# Patient Record
Sex: Male | Born: 1940 | Race: White | Hispanic: No | Marital: Married | Smoking: Never smoker
Health system: Southern US, Community
[De-identification: ages and names within clinical notes are randomized; demographics above are authoritative.]

## PROBLEM LIST (undated history)

## (undated) DIAGNOSIS — R42 Dizziness and giddiness: Secondary | ICD-10-CM

## (undated) DIAGNOSIS — I5032 Chronic diastolic (congestive) heart failure: Secondary | ICD-10-CM

## (undated) DIAGNOSIS — E119 Type 2 diabetes mellitus without complications: Secondary | ICD-10-CM

## (undated) DIAGNOSIS — G459 Transient cerebral ischemic attack, unspecified: Secondary | ICD-10-CM

## (undated) DIAGNOSIS — K635 Polyp of colon: Secondary | ICD-10-CM

## (undated) DIAGNOSIS — I679 Cerebrovascular disease, unspecified: Secondary | ICD-10-CM

## (undated) DIAGNOSIS — I4891 Unspecified atrial fibrillation: Secondary | ICD-10-CM

## (undated) DIAGNOSIS — R079 Chest pain, unspecified: Secondary | ICD-10-CM

## (undated) DIAGNOSIS — R0600 Dyspnea, unspecified: Secondary | ICD-10-CM

## (undated) DIAGNOSIS — G4733 Obstructive sleep apnea (adult) (pediatric): Secondary | ICD-10-CM

## (undated) DIAGNOSIS — I10 Essential (primary) hypertension: Secondary | ICD-10-CM

## (undated) DIAGNOSIS — Z86010 Personal history of colonic polyps: Secondary | ICD-10-CM

## (undated) DIAGNOSIS — C4490 Unspecified malignant neoplasm of skin, unspecified: Secondary | ICD-10-CM

## (undated) DIAGNOSIS — E669 Obesity, unspecified: Secondary | ICD-10-CM

## (undated) DIAGNOSIS — M6289 Other specified disorders of muscle: Secondary | ICD-10-CM

## (undated) DIAGNOSIS — N182 Chronic kidney disease, stage 2 (mild): Secondary | ICD-10-CM

## (undated) DIAGNOSIS — E785 Hyperlipidemia, unspecified: Secondary | ICD-10-CM

## (undated) HISTORY — DX: Other specified disorders of muscle: M62.89

## (undated) HISTORY — DX: Chronic diastolic (congestive) heart failure: I50.32

## (undated) HISTORY — DX: Obstructive sleep apnea (adult) (pediatric): G47.33

## (undated) HISTORY — DX: Hyperlipidemia, unspecified: E78.5

## (undated) HISTORY — PX: COLONOSCOPY: SHX174

## (undated) HISTORY — PX: CARDIOVASCULAR STRESS TEST: SHX262

## (undated) HISTORY — DX: Unspecified malignant neoplasm of skin, unspecified: C44.90

## (undated) HISTORY — DX: Polyp of colon: K63.5

## (undated) HISTORY — DX: Transient cerebral ischemic attack, unspecified: G45.9

## (undated) HISTORY — PX: ROTATOR CUFF REPAIR: SHX139

## (undated) HISTORY — PX: LEG SURGERY: SHX1003

## (undated) HISTORY — DX: Dyspnea, unspecified: R06.00

## (undated) HISTORY — PX: DOPPLER ECHOCARDIOGRAPHY: SHX263

## (undated) HISTORY — DX: Chronic kidney disease, stage 2 (mild): N18.2

## (undated) HISTORY — PX: TONSILLECTOMY: SUR1361

## (undated) HISTORY — DX: Type 2 diabetes mellitus without complications: E11.9

## (undated) HISTORY — DX: Chest pain, unspecified: R07.9

## (undated) HISTORY — DX: Obesity, unspecified: E66.9

## (undated) HISTORY — DX: Dizziness and giddiness: R42

## (undated) HISTORY — DX: Cerebrovascular disease, unspecified: I67.9

## (undated) HISTORY — DX: Personal history of colonic polyps: Z86.010

---

## 1999-09-09 ENCOUNTER — Ambulatory Visit (HOSPITAL_COMMUNITY): Admission: RE | Admit: 1999-09-09 | Discharge: 1999-09-09 | Payer: Self-pay | Admitting: Gastroenterology

## 2002-10-19 ENCOUNTER — Inpatient Hospital Stay (HOSPITAL_COMMUNITY): Admission: EM | Admit: 2002-10-19 | Discharge: 2002-10-20 | Payer: Self-pay | Admitting: Emergency Medicine

## 2002-10-19 ENCOUNTER — Encounter: Payer: Self-pay | Admitting: Emergency Medicine

## 2002-10-19 ENCOUNTER — Encounter: Payer: Self-pay | Admitting: Internal Medicine

## 2002-10-20 ENCOUNTER — Encounter: Payer: Self-pay | Admitting: Internal Medicine

## 2002-10-24 ENCOUNTER — Ambulatory Visit (HOSPITAL_COMMUNITY): Admission: RE | Admit: 2002-10-24 | Discharge: 2002-10-24 | Payer: Self-pay | Admitting: Family Medicine

## 2003-10-03 ENCOUNTER — Ambulatory Visit (HOSPITAL_COMMUNITY): Admission: RE | Admit: 2003-10-03 | Discharge: 2003-10-03 | Payer: Self-pay | Admitting: Family Medicine

## 2007-09-07 DIAGNOSIS — E785 Hyperlipidemia, unspecified: Secondary | ICD-10-CM

## 2007-09-07 HISTORY — DX: Hyperlipidemia, unspecified: E78.5

## 2007-12-28 ENCOUNTER — Encounter: Admission: RE | Admit: 2007-12-28 | Discharge: 2007-12-28 | Payer: Self-pay | Admitting: Orthopedic Surgery

## 2008-04-26 ENCOUNTER — Ambulatory Visit (HOSPITAL_COMMUNITY): Admission: RE | Admit: 2008-04-26 | Discharge: 2008-04-26 | Payer: Self-pay | Admitting: Family Medicine

## 2010-07-05 ENCOUNTER — Inpatient Hospital Stay (HOSPITAL_COMMUNITY)
Admission: EM | Admit: 2010-07-05 | Discharge: 2010-07-07 | Payer: Self-pay | Source: Home / Self Care | Admitting: Emergency Medicine

## 2010-07-11 ENCOUNTER — Encounter (INDEPENDENT_AMBULATORY_CARE_PROVIDER_SITE_OTHER): Payer: Self-pay | Admitting: Emergency Medicine

## 2010-07-11 ENCOUNTER — Emergency Department (HOSPITAL_COMMUNITY): Admission: EM | Admit: 2010-07-11 | Discharge: 2010-07-11 | Payer: Self-pay | Admitting: Emergency Medicine

## 2010-07-29 ENCOUNTER — Inpatient Hospital Stay (HOSPITAL_COMMUNITY): Admission: AD | Admit: 2010-07-29 | Discharge: 2010-08-05 | Payer: Self-pay | Admitting: Orthopedic Surgery

## 2010-08-01 ENCOUNTER — Ambulatory Visit: Payer: Self-pay | Admitting: Vascular Surgery

## 2010-11-17 LAB — CROSSMATCH
ABO/RH(D): O POS
Antibody Screen: NEGATIVE
Unit division: 0
Unit division: 0
Unit division: 0
Unit division: 0

## 2010-11-17 LAB — GLUCOSE, CAPILLARY
Glucose-Capillary: 111 mg/dL — ABNORMAL HIGH (ref 70–99)
Glucose-Capillary: 120 mg/dL — ABNORMAL HIGH (ref 70–99)
Glucose-Capillary: 125 mg/dL — ABNORMAL HIGH (ref 70–99)
Glucose-Capillary: 127 mg/dL — ABNORMAL HIGH (ref 70–99)
Glucose-Capillary: 131 mg/dL — ABNORMAL HIGH (ref 70–99)
Glucose-Capillary: 149 mg/dL — ABNORMAL HIGH (ref 70–99)
Glucose-Capillary: 170 mg/dL — ABNORMAL HIGH (ref 70–99)
Glucose-Capillary: 176 mg/dL — ABNORMAL HIGH (ref 70–99)
Glucose-Capillary: 178 mg/dL — ABNORMAL HIGH (ref 70–99)
Glucose-Capillary: 179 mg/dL — ABNORMAL HIGH (ref 70–99)
Glucose-Capillary: 179 mg/dL — ABNORMAL HIGH (ref 70–99)
Glucose-Capillary: 192 mg/dL — ABNORMAL HIGH (ref 70–99)
Glucose-Capillary: 196 mg/dL — ABNORMAL HIGH (ref 70–99)
Glucose-Capillary: 260 mg/dL — ABNORMAL HIGH (ref 70–99)
Glucose-Capillary: 350 mg/dL — ABNORMAL HIGH (ref 70–99)
Glucose-Capillary: 77 mg/dL (ref 70–99)
Glucose-Capillary: 81 mg/dL (ref 70–99)
Glucose-Capillary: 82 mg/dL (ref 70–99)
Glucose-Capillary: 99 mg/dL (ref 70–99)

## 2010-11-17 LAB — CBC
HCT: 23.9 % — ABNORMAL LOW (ref 39.0–52.0)
HCT: 26.2 % — ABNORMAL LOW (ref 39.0–52.0)
HCT: 26.2 % — ABNORMAL LOW (ref 39.0–52.0)
HCT: 26.9 % — ABNORMAL LOW (ref 39.0–52.0)
HCT: 32.6 % — ABNORMAL LOW (ref 39.0–52.0)
Hemoglobin: 10 g/dL — ABNORMAL LOW (ref 13.0–17.0)
Hemoglobin: 10.4 g/dL — ABNORMAL LOW (ref 13.0–17.0)
Hemoglobin: 10.5 g/dL — ABNORMAL LOW (ref 13.0–17.0)
Hemoglobin: 7.7 g/dL — ABNORMAL LOW (ref 13.0–17.0)
Hemoglobin: 8.9 g/dL — ABNORMAL LOW (ref 13.0–17.0)
MCH: 26.9 pg (ref 26.0–34.0)
MCH: 27.4 pg (ref 26.0–34.0)
MCH: 27.4 pg (ref 26.0–34.0)
MCH: 27.8 pg (ref 26.0–34.0)
MCH: 27.8 pg (ref 26.0–34.0)
MCH: 29.2 pg (ref 26.0–34.0)
MCHC: 31.9 g/dL (ref 30.0–36.0)
MCHC: 32.1 g/dL (ref 30.0–36.0)
MCHC: 32.2 g/dL (ref 30.0–36.0)
MCHC: 32.9 g/dL (ref 30.0–36.0)
MCHC: 34 g/dL (ref 30.0–36.0)
MCV: 84.1 fL (ref 78.0–100.0)
MCV: 84.2 fL (ref 78.0–100.0)
MCV: 85.1 fL (ref 78.0–100.0)
MCV: 85.3 fL (ref 78.0–100.0)
MCV: 85.9 fL (ref 78.0–100.0)
Platelets: 168 10*3/uL (ref 150–400)
Platelets: 242 10*3/uL (ref 150–400)
Platelets: 306 10*3/uL (ref 150–400)
Platelets: 401 10*3/uL — ABNORMAL HIGH (ref 150–400)
RBC: 2.81 MIL/uL — ABNORMAL LOW (ref 4.22–5.81)
RBC: 3.05 MIL/uL — ABNORMAL LOW (ref 4.22–5.81)
RBC: 3.2 MIL/uL — ABNORMAL LOW (ref 4.22–5.81)
RBC: 3.6 MIL/uL — ABNORMAL LOW (ref 4.22–5.81)
RBC: 3.87 MIL/uL — ABNORMAL LOW (ref 4.22–5.81)
RBC: 3.91 MIL/uL — ABNORMAL LOW (ref 4.22–5.81)
RDW: 13.8 % (ref 11.5–15.5)
RDW: 15 % (ref 11.5–15.5)
RDW: 15.1 % (ref 11.5–15.5)
RDW: 15.5 % (ref 11.5–15.5)
RDW: 15.8 % — ABNORMAL HIGH (ref 11.5–15.5)
WBC: 3.8 10*3/uL — ABNORMAL LOW (ref 4.0–10.5)
WBC: 3.9 10*3/uL — ABNORMAL LOW (ref 4.0–10.5)
WBC: 3.9 10*3/uL — ABNORMAL LOW (ref 4.0–10.5)
WBC: 4.2 10*3/uL (ref 4.0–10.5)
WBC: 4.3 10*3/uL (ref 4.0–10.5)
WBC: 4.5 10*3/uL (ref 4.0–10.5)
WBC: 5.4 10*3/uL (ref 4.0–10.5)

## 2010-11-17 LAB — SURGICAL PCR SCREEN
MRSA, PCR: NEGATIVE
Staphylococcus aureus: NEGATIVE

## 2010-11-17 LAB — BASIC METABOLIC PANEL
BUN: 15 mg/dL (ref 6–23)
BUN: 17 mg/dL (ref 6–23)
BUN: 19 mg/dL (ref 6–23)
BUN: 22 mg/dL (ref 6–23)
CO2: 23 mEq/L (ref 19–32)
CO2: 24 mEq/L (ref 19–32)
CO2: 24 mEq/L (ref 19–32)
CO2: 25 mEq/L (ref 19–32)
CO2: 25 mEq/L (ref 19–32)
Calcium: 8.1 mg/dL — ABNORMAL LOW (ref 8.4–10.5)
Calcium: 8.2 mg/dL — ABNORMAL LOW (ref 8.4–10.5)
Calcium: 8.2 mg/dL — ABNORMAL LOW (ref 8.4–10.5)
Calcium: 8.4 mg/dL (ref 8.4–10.5)
Calcium: 9.1 mg/dL (ref 8.4–10.5)
Chloride: 103 mEq/L (ref 96–112)
Chloride: 107 mEq/L (ref 96–112)
Chloride: 108 mEq/L (ref 96–112)
Chloride: 109 mEq/L (ref 96–112)
Chloride: 109 mEq/L (ref 96–112)
Creatinine, Ser: 1.35 mg/dL (ref 0.4–1.5)
Creatinine, Ser: 1.55 mg/dL — ABNORMAL HIGH (ref 0.4–1.5)
Creatinine, Ser: 1.56 mg/dL — ABNORMAL HIGH (ref 0.4–1.5)
GFR calc Af Amer: 54 mL/min — ABNORMAL LOW (ref >60)
GFR calc Af Amer: 54 mL/min — ABNORMAL LOW (ref >60)
GFR calc Af Amer: 59 mL/min — ABNORMAL LOW (ref >60)
GFR calc Af Amer: >60 mL/min (ref >60)
GFR calc Af Amer: >60 mL/min (ref >60)
GFR calc non Af Amer: 44 mL/min — ABNORMAL LOW (ref >60)
GFR calc non Af Amer: 45 mL/min — ABNORMAL LOW (ref >60)
GFR calc non Af Amer: 52 mL/min — ABNORMAL LOW (ref >60)
GFR calc non Af Amer: 59 mL/min — ABNORMAL LOW (ref >60)
Glucose, Bld: 169 mg/dL — ABNORMAL HIGH (ref 70–99)
Glucose, Bld: 174 mg/dL — ABNORMAL HIGH (ref 70–99)
Glucose, Bld: 62 mg/dL — ABNORMAL LOW (ref 70–99)
Glucose, Bld: 75 mg/dL (ref 70–99)
Potassium: 4.2 mEq/L (ref 3.5–5.1)
Potassium: 4.5 mEq/L (ref 3.5–5.1)
Potassium: 4.6 mEq/L (ref 3.5–5.1)
Potassium: 4.8 mEq/L (ref 3.5–5.1)
Sodium: 135 mEq/L (ref 135–145)
Sodium: 138 mEq/L (ref 135–145)
Sodium: 139 mEq/L (ref 135–145)
Sodium: 140 mEq/L (ref 135–145)

## 2010-11-17 LAB — PROTIME-INR
INR: 1.13 (ref 0.00–1.49)
INR: 1.19 (ref 0.00–1.49)
Prothrombin Time: 14.7 seconds (ref 11.6–15.2)
Prothrombin Time: 15.3 seconds — ABNORMAL HIGH (ref 11.6–15.2)

## 2010-11-17 LAB — ANAEROBIC CULTURE

## 2010-11-17 LAB — TISSUE CULTURE: Culture: NO GROWTH

## 2010-11-17 LAB — PREPARE RBC (CROSSMATCH)

## 2010-11-17 LAB — APTT: aPTT: 29 seconds (ref 24–37)

## 2010-11-18 LAB — COMPREHENSIVE METABOLIC PANEL
ALT: 18 U/L (ref 0–53)
AST: 20 U/L (ref 0–37)
Albumin: 3.3 g/dL — ABNORMAL LOW (ref 3.5–5.2)
Alkaline Phosphatase: 101 U/L (ref 39–117)
Calcium: 8.1 mg/dL — ABNORMAL LOW (ref 8.4–10.5)
GFR calc Af Amer: 44 mL/min — ABNORMAL LOW (ref >60)
Potassium: 3.4 mEq/L — ABNORMAL LOW (ref 3.5–5.1)
Sodium: 139 mEq/L (ref 135–145)
Total Protein: 5.5 g/dL — ABNORMAL LOW (ref 6.0–8.3)

## 2010-11-18 LAB — DIFFERENTIAL
Basophils Relative: 0 % (ref 0–1)
Eosinophils Absolute: 0 10*3/uL (ref 0.0–0.7)
Lymphs Abs: 0.9 10*3/uL (ref 0.7–4.0)
Monocytes Absolute: 0.4 10*3/uL (ref 0.1–1.0)
Monocytes Relative: 4 % (ref 3–12)

## 2010-11-18 LAB — CBC
HCT: 35.5 % — ABNORMAL LOW (ref 39.0–52.0)
Hemoglobin: 10.2 g/dL — ABNORMAL LOW (ref 13.0–17.0)
MCH: 28.5 pg (ref 26.0–34.0)
MCHC: 33.8 g/dL (ref 30.0–36.0)
Platelets: 185 10*3/uL (ref 150–400)
RBC: 3.58 MIL/uL — ABNORMAL LOW (ref 4.22–5.81)
RDW: 13.6 % (ref 11.5–15.5)
WBC: 6.1 10*3/uL (ref 4.0–10.5)
WBC: 9.4 10*3/uL (ref 4.0–10.5)

## 2010-11-18 LAB — GLUCOSE, CAPILLARY
Glucose-Capillary: 210 mg/dL — ABNORMAL HIGH (ref 70–99)
Glucose-Capillary: 224 mg/dL — ABNORMAL HIGH (ref 70–99)
Glucose-Capillary: 252 mg/dL — ABNORMAL HIGH (ref 70–99)

## 2010-11-18 LAB — POCT I-STAT, CHEM 8
BUN: 29 mg/dL — ABNORMAL HIGH (ref 6–23)
Calcium, Ion: 0.9 mmol/L — ABNORMAL LOW (ref 1.12–1.32)
Potassium: 3.5 mEq/L (ref 3.5–5.1)
Sodium: 139 mEq/L (ref 135–145)
TCO2: 21 mmol/L (ref 0–100)

## 2010-11-18 LAB — CROSSMATCH
ABO/RH(D): O POS
Unit division: 0
Unit division: 0

## 2010-11-18 LAB — BASIC METABOLIC PANEL
BUN: 26 mg/dL — ABNORMAL HIGH (ref 6–23)
Calcium: 8.3 mg/dL — ABNORMAL LOW (ref 8.4–10.5)
Creatinine, Ser: 1.63 mg/dL — ABNORMAL HIGH (ref 0.4–1.5)
GFR calc non Af Amer: 42 mL/min — ABNORMAL LOW (ref >60)

## 2010-11-18 LAB — PROTIME-INR: INR: 1.08 (ref 0.00–1.49)

## 2010-11-18 LAB — URINALYSIS, ROUTINE W REFLEX MICROSCOPIC
Glucose, UA: 100 mg/dL — AB
Nitrite: NEGATIVE
Protein, ur: NEGATIVE mg/dL
Urobilinogen, UA: 0.2 mg/dL (ref 0.0–1.0)

## 2010-11-18 LAB — MRSA PCR SCREENING: MRSA by PCR: NEGATIVE

## 2010-11-18 LAB — ABO/RH: ABO/RH(D): O POS

## 2010-11-18 LAB — APTT: aPTT: 21 seconds — ABNORMAL LOW (ref 24–37)

## 2011-01-22 NOTE — Discharge Summary (Signed)
NAME:  Micheal Holt, Micheal Holt NO.:  [id number]   MEDICAL RECORD NO.:  [id number]                   PATIENT TYPE:  INP   LOCATION:  4703                                 FACILITY:  [facility]   PHYSICIAN:  Thornell Mule, M.D.                DATE OF BIRTH:  04/19/1941   DATE OF ADMISSION:  10/19/2002  DATE OF DISCHARGE:  10/20/2002                                 DISCHARGE SUMMARY   DISCHARGE DIAGNOSES:  1. Transient ischemic attack.  2. Hypertension.  3. Diabetes.   DISCHARGE MEDICATIONS:  1. Plavix 75 mg p.o. daily.  2. Glucophage 500 mg 3 pills q.h.s.  3. Verapamil 240 mg p.o. daily.   FOLLOW UP:  The patient has a followup appointment with Dr. Arelia Sneddon which was  previously scheduled and the patient was instructed to call Dr. Kennedy Bucker to  try to set up a neurology appointment or the patient can also try to set  this up via his primary care physician.  He was given instructions and the  phone number for Dr. Kennedy Bucker.   HISTORY OF PRESENT ILLNESS:  This is a 70 year old male with a previous  medical history significant for hypertension in the 80s who was in his usual  state of health when suddenly on the day of admission he felt detached and  somewhat dizzy with right-handed weakness and numbness.  He mentions he was  unable to do what he intended with computer mouse.  The patient got  anxious and wanted to come to the ER.   PHYSICAL EXAMINATION:  GENERAL APPEARANCE:  The patient was a little bit  anxious.  His physical examination was benign.  VITAL SIGNS:  Pulse 62, blood pressure 230/147 which later decreased to  180/93, temperature 97.8, respiratory rate 12.  His O2 saturation was 99% on  room air.  NEUROLOGIC:  Examination was nonfocal.  His cranial nerves were intact.  His  deep tendon reflexes were 1+ throughout.  His strength was 5/5.  He had no  mentioned sensory deficits.   LABORATORY DATA:  He had an EKG which was sinus rhythm with first  degree AV  block.   His troponins were 0.02, CK 59, MB 1.2.  He had an ABG which showed pH 7.45,  pCO2 of 35.9, pO2 of 83.  He had a BMP with a sodium of 139, potassium 3.1,  chloride 110, bicarb 23, BUN 15 and creatinine of 1.3.  The patient's  glucose was 182.  LFTs were within normal limits.  He had a negative urine  drug screen.  Negative UA.  His white blood cell count was 4.8, hemoglobin  15.1, platelets 249.  PT 13.9 and PTT was 32.   HOSPITAL COURSE:  1. Transient ischemic attack:  The patient had no neurologic findings on     examination.  His blood pressure became under better control with one  dose of Labetalol in the ER.  We followed up his troponin, CK and MB     which continued to be within normal range. Troponin remained at 0.02     throughout the three times it was drawn.  The CK 59 to 58, and MB was     from 1.2 to 1.0.  The patient had a TSH which was within normal limits at     3.011 and a cortisol level of 5.2.  A carotid Doppler was recommended,     however, being a Sunday night, it was not done.  The patient had a CT of     the head which was negative.  The patient had an MRI/MRA which showed     intracranial atherosclerotic disease involving the right ACA, right MCA     and distal basilar.  There was also moderate stenosis in the right     posterior cerebral artery in the mid portion.  The final results were not     available prior to discharge, however, they will review it with the     radiologist by Dr. Sharlet Salina prior to discharge.  The patient appeared to     have a __________  from his TIA.  His blood pressure as mentioned before     was under control and we decided to continue him on the verapamil and     aspirin.  He was educated on the importance of having a followup with his     primary care physician.  2. Diabetes:  He had moderate to good control with his ADA diet and     Glucophage.  3. The patient did complain of urinary frequency, however, it was  attributed     secondary to his diabetes.   CONDITION ON DISCHARGE:  The patient was stable.  His vital signs were  temperature 97.9, blood pressure 152/70, heart rate 50.  He was saturating  96% on room air.  He was in no acute distress.  Heart was regular rate and  rhythm.  Lungs were bilaterally clear to auscultation  with good air  movement.  His abdomen was benign.  He had no clubbing, cyanosis, or edema.   His labs on discharge were sodium 138, potassium 3.7, chloride 107, bicarb  24, glucose 133, BUN 14, creatinine 1.3.                                               Thornell Mule, M.D.    LC/MEDQ  D:  11/26/2002  T:  11/27/2002  Job:  [id number]   cc:   C. Milta Deiters, M.D.  [address] [place]  [place] [address]  Fax: [phone]
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
## 2013-01-27 ENCOUNTER — Other Ambulatory Visit: Payer: Self-pay | Admitting: Cardiovascular Disease

## 2013-04-09 ENCOUNTER — Other Ambulatory Visit: Payer: Self-pay | Admitting: Cardiovascular Disease

## 2013-04-09 NOTE — Telephone Encounter (Signed)
Rx was sent to pharmacy electronically. 

## 2013-04-10 ENCOUNTER — Other Ambulatory Visit: Payer: Self-pay | Admitting: Cardiovascular Disease

## 2013-04-10 NOTE — Telephone Encounter (Signed)
Rx was sent to pharmacy electronically. 

## 2013-04-11 ENCOUNTER — Ambulatory Visit (INDEPENDENT_AMBULATORY_CARE_PROVIDER_SITE_OTHER): Payer: Medicare Other | Admitting: Cardiovascular Disease

## 2013-04-11 ENCOUNTER — Encounter: Payer: Self-pay | Admitting: *Deleted

## 2013-04-11 VITALS — BP 116/66 | Ht 70.0 in | Wt 255.8 lb

## 2013-04-11 DIAGNOSIS — R12 Heartburn: Secondary | ICD-10-CM

## 2013-04-11 DIAGNOSIS — G4733 Obstructive sleep apnea (adult) (pediatric): Secondary | ICD-10-CM

## 2013-04-11 DIAGNOSIS — I669 Occlusion and stenosis of unspecified cerebral artery: Secondary | ICD-10-CM

## 2013-04-11 DIAGNOSIS — N182 Chronic kidney disease, stage 2 (mild): Secondary | ICD-10-CM

## 2013-04-11 DIAGNOSIS — I10 Essential (primary) hypertension: Secondary | ICD-10-CM

## 2013-04-11 DIAGNOSIS — E785 Hyperlipidemia, unspecified: Secondary | ICD-10-CM

## 2013-04-11 DIAGNOSIS — I6622 Occlusion and stenosis of left posterior cerebral artery: Secondary | ICD-10-CM

## 2013-04-11 DIAGNOSIS — E119 Type 2 diabetes mellitus without complications: Secondary | ICD-10-CM

## 2013-04-11 NOTE — Patient Instructions (Addendum)
Your physician recommends that you schedule a follow-up appointment in: One year.  

## 2013-04-12 DIAGNOSIS — E785 Hyperlipidemia, unspecified: Secondary | ICD-10-CM | POA: Insufficient documentation

## 2013-04-12 DIAGNOSIS — G4733 Obstructive sleep apnea (adult) (pediatric): Secondary | ICD-10-CM | POA: Insufficient documentation

## 2013-04-12 DIAGNOSIS — I10 Essential (primary) hypertension: Secondary | ICD-10-CM | POA: Insufficient documentation

## 2013-04-12 DIAGNOSIS — I672 Cerebral atherosclerosis: Secondary | ICD-10-CM | POA: Insufficient documentation

## 2013-04-12 DIAGNOSIS — N184 Chronic kidney disease, stage 4 (severe): Secondary | ICD-10-CM | POA: Insufficient documentation

## 2013-04-12 DIAGNOSIS — R12 Heartburn: Secondary | ICD-10-CM | POA: Insufficient documentation

## 2013-04-12 DIAGNOSIS — E1165 Type 2 diabetes mellitus with hyperglycemia: Secondary | ICD-10-CM | POA: Insufficient documentation

## 2013-04-12 NOTE — Assessment & Plan Note (Signed)
He has made small but real progress with weight loss he is congratulated and encouraged to persist in his efforts

## 2013-04-12 NOTE — Assessment & Plan Note (Signed)
Adequate control. 

## 2013-04-12 NOTE — Assessment & Plan Note (Signed)
Most recent hemoglobin A1c in October 2000 was 7.2% approaching the goal level but higher than last checked in June 2013 when it was 7.1%

## 2013-04-12 NOTE — Assessment & Plan Note (Signed)
Stable renal function. Most recent creatinine 1.04 is actually an improvement corresponding to a GFR of about 70.

## 2013-04-12 NOTE — Assessment & Plan Note (Signed)
No neurological complaints. On clopidogrel since 2004 secondary to a transient ischemic attack.

## 2013-04-12 NOTE — Assessment & Plan Note (Signed)
The most recent lipid profile that I have from October shows satisfactory lipid levels with the exception of a borderline low HDL of 38. Additional weight loss and better glycemic control will help to improve this number.

## 2013-04-12 NOTE — Progress Notes (Signed)
Patient ID: Micheal Holt, male   DOB: 1941/08/08, 72 y.o.   MRN: [id number]     Reason for office visit Followup chest pain and cardiovascular risk factors  Micheal Holt has made some progress with weight loss. He now weighs about 10 pounds less than he did a year ago. He is walking on a regular basis at a relatively slow pace. He denies any problems with chest pain or shortness of breath. Has not had any recurrence of heart failure or lower showed edema. Diabetes control is fair, albeit imperfect. His blood pressure is well controlled and his renal function actually seems to have improved. He is compliant with statin medication.    No Known Allergies  Current Outpatient Prescriptions  Medication Sig Dispense Refill  . aspirin 81 MG tablet Take 81 mg by mouth daily.      Marland Kitchen atorvastatin (LIPITOR) 20 MG tablet TAKE 1 TABLET BY MOUTH EVERY NIGHT AT BEDTIME  30 tablet  5  . clopidogrel (PLAVIX) 75 MG tablet TAKE 1 TABLET BY MOUTH ONCE DAILY  30 tablet  6  . furosemide (LASIX) 40 MG tablet Take 40 mg by mouth daily.      . metFORMIN (GLUCOPHAGE) 1000 MG tablet Take 1,000 mg by mouth 2 (two) times daily with a meal.      . Potassium Chloride Crys CR (KLOR-CON M20 PO) Take 20 mEq by mouth 2 (two) times daily.      . solifenacin (VESICARE) 10 MG tablet Take 10 mg by mouth daily.      Marland Kitchen terazosin (HYTRIN) 10 MG capsule TAKE 1 CAPSULE BY MOUTH EVERY NIGHT AT BEDTIME  30 capsule  5   No current facility-administered medications for this visit.    Past Medical History  Diagnosis Date  . Chronic diastolic CHF (congestive heart failure), NYHA class 1     currently euvolemic  . Systemic hypertension   . Obstructive sleep apnea     he refused to use cpap  . Obesity     moderate to severe  . DM (diabetes mellitus)     type 2  . Chronic kidney disease (CKD), stage II (mild)   . Chest pain     most likely Gastroesophageal reflux  . TIA (transient ischemic attack)     in Feb of 2004  . Cerebrovascular  disease     by MRA in Feb 2004 with moderate posterior cerebral artery stenosis    Past Surgical History  Procedure Laterality Date  . Doppler echocardiography      renal artery and they were within normal limits  . Cardiovascular stress test      within normal limits EF of 54%    History reviewed. No pertinent family history.  History   Social History  . Marital Status: Married    Spouse Name: N/A    Number of Children: N/A  . Years of Education: N/A   Occupational History  . Not on file.   Social History Main Topics  . Smoking status: Former Research scientist (life sciences)  . Smokeless tobacco: Not on file  . Alcohol Use: No  . Drug Use: Not on file  . Sexually Active: Not on file   Other Topics Concern  . Not on file   Social History Narrative  . No narrative on file    Review of systems: The patient specifically denies any chest pain at rest or with exertion, dyspnea at rest or with exertion, orthopnea, paroxysmal nocturnal dyspnea, syncope, palpitations, focal neurological deficits,  intermittent claudication, lower extremity edema, unexplained weight gain, cough, hemoptysis or wheezing. He seems to describe dysphagia. The patient also denies abdominal pain, nausea, vomiting, diarrhea, constipation, polyuria, polydipsia, dysuria, hematuria, frequency, urgency, abnormal bleeding or bruising, fever, chills, unexpected weight changes, mood swings, change in skin or hair texture, change in voice quality, auditory or visual problems, allergic reactions or rashes, new musculoskeletal complaints other than usual "aches and pains".   PHYSICAL EXAM BP 116/66  Ht 5\' 10"  (1.778 m)  Wt 255 lb 12.8 oz (116.03 kg)  BMI 36.7 kg/m2  General: Alert, oriented x3, no distress Head: no evidence of trauma, PERRL, EOMI, no exophtalmos or lid lag, no myxedema, no xanthelasma; normal ears, nose and oropharynx Neck: normal jugular venous pulsations and no hepatojugular reflux; brisk carotid pulses without  delay and no carotid bruits Chest: clear to auscultation, no signs of consolidation by percussion or palpation, normal fremitus, symmetrical and full respiratory excursions Cardiovascular: normal position and quality of the apical impulse, regular rhythm, normal first and second heart sounds, no murmurs, rubs, +ve S4 Abdomen: no tenderness or distention, no masses by palpation, no abnormal pulsatility or arterial bruits, normal bowel sounds, no hepatosplenomegaly Extremities: no clubbing, cyanosis or edema; 2+ radial, ulnar and brachial pulses bilaterally; 2+ right femoral, posterior tibial and dorsalis pedis pulses; 2+ left femoral, posterior tibial and dorsalis pedis pulses; no subclavian or femoral bruits Neurological: grossly nonfocal   EKG: Normal sinus rhythm, normal tracing  Lipid Panel  October 2013 total cholesterol 107, triglycerides 84, HDL 38, LDL 52 Hemoglobin A1c 7.2% June 2013 creatinine 1.04, estimated GFR 72, potassium 4.3, sodium 147  BMET    Component Value Date/Time   NA 140 08/05/2010 0410   K 4.3 08/05/2010 0410   CL 109 08/05/2010 0410   CO2 24 08/05/2010 0410   GLUCOSE 127* 08/05/2010 0410   BUN 19 08/05/2010 0410   CREATININE 1.33 08/05/2010 0410   CALCIUM 8.2* 08/05/2010 0410   GFRNONAA 53* 08/05/2010 0410   GFRAA  Value: >60        The eGFR has been calculated using the MDRD equation. This calculation has not been validated in all clinical situations. eGFR's persistently <60 mL/min signify possible Chronic Kidney Disease. 08/05/2010 0410     ASSESSMENT AND PLAN Heartburn The chest discomfort that he describes as previous appointment was completely resolved when he took omeprazole. The symptoms recurred when he discontinued the medication so he still using it on an as-needed basis. I have told him that he needs to seek gastroenterology evaluation should he develop dysphagia, gastrointestinal bleeding or unexplained weight loss. After I mentioned that, he  describes the fact that he has noticed that his "throat to smaller" than in the past. He may be describing a stricture. I recommended that he seek GI evaluation.  OSA (obstructive sleep apnea) He did not tolerate CPAP when he tried in the past and is not willing to try again  HTN (hypertension) Adequate control  Hyperlipidemia The most recent lipid profile that I have from October shows satisfactory lipid levels with the exception of a borderline low HDL of 38. Additional weight loss and better glycemic control will help to improve this number.  CKD (chronic kidney disease) stage 2, GFR 60-89 ml/min Stable renal function. Most recent creatinine 1.04 is actually an improvement corresponding to a GFR of about 70.  DM2 (diabetes mellitus, type 2) Most recent hemoglobin A1c in October 2000 was 7.2% approaching the goal level but higher than last checked  in June 2013 when it was 7.1%  Severe obesity (BMI 35.0-39.9) He has made small but real progress with weight loss he is congratulated and encouraged to persist in his efforts  Stenosis of posterior cerebral artery No neurological complaints. On clopidogrel since 2004 secondary to a transient ischemic attack.   Orders Placed This Encounter  Procedures  . EKG 12-Lead   Meds ordered this encounter  Medications  . aspirin 81 MG tablet    Sig: Take 81 mg by mouth daily.  . solifenacin (VESICARE) 10 MG tablet    Sig: Take 10 mg by mouth daily.  . metFORMIN (GLUCOPHAGE) 1000 MG tablet    Sig: Take 1,000 mg by mouth 2 (two) times daily with a meal.  . Potassium Chloride Crys CR (KLOR-CON M20 PO)    Sig: Take 20 mEq by mouth 2 (two) times daily.  . furosemide (LASIX) 40 MG tablet    Sig: Take 40 mg by mouth daily.    Holli Humbles, MD, [facility] [phone] office [phone] pager

## 2013-04-12 NOTE — Assessment & Plan Note (Signed)
He did not tolerate CPAP when he tried in the past and is not willing to try again

## 2013-04-12 NOTE — Assessment & Plan Note (Signed)
The chest discomfort that he describes as previous appointment was completely resolved when he took omeprazole. The symptoms recurred when he discontinued the medication so he still using it on an as-needed basis. I have told him that he needs to seek gastroenterology evaluation should he develop dysphagia, gastrointestinal bleeding or unexplained weight loss. After I mentioned that, he describes the fact that he has noticed that his "throat to smaller" than in the past. He may be describing a stricture. I recommended that he seek GI evaluation.

## 2013-05-16 ENCOUNTER — Other Ambulatory Visit: Payer: Self-pay | Admitting: Cardiovascular Disease

## 2013-05-16 NOTE — Telephone Encounter (Signed)
Rx was sent to pharmacy electronically. 

## 2013-09-04 ENCOUNTER — Other Ambulatory Visit: Payer: Self-pay | Admitting: Cardiovascular Disease

## 2013-09-04 NOTE — Telephone Encounter (Signed)
Rx was sent to pharmacy electronically. 

## 2013-10-25 ENCOUNTER — Other Ambulatory Visit: Payer: Self-pay | Admitting: Cardiovascular Disease

## 2013-12-31 ENCOUNTER — Other Ambulatory Visit: Payer: Self-pay | Admitting: Cardiovascular Disease

## 2013-12-31 NOTE — Telephone Encounter (Signed)
Rx was sent to pharmacy electronically. 

## 2014-01-16 ENCOUNTER — Other Ambulatory Visit: Payer: Self-pay | Admitting: Cardiovascular Disease

## 2014-01-16 NOTE — Telephone Encounter (Signed)
Rx was sent to pharmacy electronically. 

## 2014-05-11 ENCOUNTER — Other Ambulatory Visit: Payer: Self-pay | Admitting: Cardiovascular Disease

## 2014-05-14 NOTE — Telephone Encounter (Signed)
Rx refill sent to patient pharmacy   

## 2014-05-15 ENCOUNTER — Telehealth: Payer: Self-pay | Admitting: Cardiovascular Disease

## 2014-05-15 NOTE — Telephone Encounter (Signed)
States our office denied a medication but he does not know the name of the drug.  Spoke w/pharmacist who doesn't show we denied any med in fact has several meds to be picked up.  In the meantime patient did make a yearly appt. Called patient back and gave him above info.  Told to call if he is missing any meds - not to let himself run out and we will see him for his appt 10/15.  Patient voiced understanding.

## 2014-05-15 NOTE — Telephone Encounter (Signed)
Pt called in stating that he called in to the pharmacy for his prescriptions to be refilled and he said that those medications were denied and he is not sure why. Please call  Thanks

## 2014-06-10 ENCOUNTER — Other Ambulatory Visit: Payer: Self-pay | Admitting: Cardiovascular Disease

## 2014-06-10 NOTE — Telephone Encounter (Signed)
Rx was sent to pharmacy electronically. OV 10/14

## 2014-06-13 ENCOUNTER — Other Ambulatory Visit: Payer: Self-pay | Admitting: Cardiovascular Disease

## 2014-06-14 NOTE — Telephone Encounter (Signed)
Rx was sent to pharmacy electronically. 

## 2014-06-15 ENCOUNTER — Other Ambulatory Visit: Payer: Self-pay | Admitting: Cardiovascular Disease

## 2014-06-17 NOTE — Telephone Encounter (Signed)
Refused >> Refilled #30 tablet with 0 refills on 06/14/2014  Appointment 06/19/14

## 2014-06-19 ENCOUNTER — Ambulatory Visit (INDEPENDENT_AMBULATORY_CARE_PROVIDER_SITE_OTHER): Payer: Medicare Other | Admitting: Cardiovascular Disease

## 2014-06-19 ENCOUNTER — Encounter: Payer: Self-pay | Admitting: Cardiovascular Disease

## 2014-06-19 VITALS — BP 168/88 | HR 67 | Resp 16 | Ht 70.0 in | Wt 250.4 lb

## 2014-06-19 DIAGNOSIS — E1122 Type 2 diabetes mellitus with diabetic chronic kidney disease: Secondary | ICD-10-CM

## 2014-06-19 DIAGNOSIS — E785 Hyperlipidemia, unspecified: Secondary | ICD-10-CM

## 2014-06-19 DIAGNOSIS — I10 Essential (primary) hypertension: Secondary | ICD-10-CM

## 2014-06-19 DIAGNOSIS — G4733 Obstructive sleep apnea (adult) (pediatric): Secondary | ICD-10-CM

## 2014-06-19 DIAGNOSIS — N182 Chronic kidney disease, stage 2 (mild): Secondary | ICD-10-CM

## 2014-06-19 DIAGNOSIS — N189 Chronic kidney disease, unspecified: Secondary | ICD-10-CM

## 2014-06-19 DIAGNOSIS — I672 Cerebral atherosclerosis: Secondary | ICD-10-CM

## 2014-06-19 MED ORDER — AMLODIPINE BESYLATE 5 MG PO TABS: 5.0000 mg | ORAL_TABLET | Freq: Every day | ORAL | Status: DC

## 2014-06-19 MED ORDER — TAMSULOSIN HCL 0.4 MG PO CAPS: 0.4000 mg | ORAL_CAPSULE | Freq: Every day | ORAL | Status: DC

## 2014-06-19 NOTE — Progress Notes (Signed)
Patient ID: Micheal Holt, male   DOB: 06-18-1941, 73 y.o.   MRN: [id number]      Reason for office visit Obstructive sleep apnea, hyperlipidemia, obesity, history of TIA, hypertension  Micheal Holt is now 73 years old and has recently developed some gait instability and headaches. A couple of nights ago when he got out of bed to use the bathroom the room was spinning and he had to hold onto the walls to avoid falling. Has noticed a tendency to lean forward when he walks. Over the last couple of weeks has also had intermittent mild headaches, which never bothered him in the past.  His diabetes is poorly controlled, his most recent hemoglobin A1c was greater than 11%. His last lipid profile was good.  He has a long standing history of obesity, type 2 diabetes mellitus, hypertension, mild chronic kidney disease, hyperlipidemia. He has obstructive sleep apnea but will not tolerate CPAP. He has a remote history of a transient ischemic attack and has been taking clopidogrel for this since 2004. He is known to have stenosis of a posterior cerebral artery.  He has had intermittent problems with chest discomfort, not currently an issue. These seemed to respond to treatment with a proton pump inhibitor. He had a normal nuclear stress test in 2009 (diaphragmatic attenuation artifact) and has normal left ventricular systolic function with mild diastolic dysfunction by echo in 2012. His left atrium is mild to moderately dilated.  No Known Allergies Current Outpatient Prescriptions on File Prior to Visit  Medication Sig Dispense Refill  . aspirin 81 MG tablet Take 81 mg by mouth daily.      Marland Kitchen atorvastatin (LIPITOR) 20 MG tablet Take 1 tablet (20 mg total) by mouth daily. *APPOINTMENT NEEDED FOR FURTHER REFILLS*  30 tablet  1  . benazepril (LOTENSIN) 40 MG tablet TAKE 1 TABLET BY MOUTH DAILY FOR HIGH BLOOD PRESSURE  30 tablet  0  . clopidogrel (PLAVIX) 75 MG tablet TAKE 1 TABLET BY MOUTH ONCE DAILY  30 tablet  1  .  furosemide (LASIX) 40 MG tablet TAKE 1 TABLET BY MOUTH DAILY MAY INCREASE TO 2 TABS BY MOUTH DAILY IFWEIGHT INCREASES TO MORE THEN 2 POUNDS FROM BASELINE  45 tablet  5  . metFORMIN (GLUCOPHAGE) 1000 MG tablet Take 1,000 mg by mouth 2 (two) times daily with a meal.      . omeprazole (PRILOSEC) 40 MG capsule TAKE 1 CAPSULE BY MOUTH ONCE DAILY  30 capsule  4  . Potassium Chloride Crys CR (KLOR-CON M20 PO) Take 20 mEq by mouth 2 (two) times daily.      . solifenacin (VESICARE) 10 MG tablet Take 10 mg by mouth daily.           Terazosin (Hytrin) 10 mg tablet      Take 10 mg by mouth daily No current facility-administered medications on file prior to visit.   Past Medical History  Diagnosis Date  . Chronic diastolic CHF (congestive heart failure), NYHA class 1     currently euvolemic  . Systemic hypertension   . Obstructive sleep apnea     he refused to use cpap  . Obesity     moderate to severe  . DM (diabetes mellitus)     type 2  . Chronic kidney disease (CKD), stage II (mild)   . Chest pain     most likely Gastroesophageal reflux  . TIA (transient ischemic attack)     in Feb of 2004  . Cerebrovascular  disease     by MRA in Feb 2004 with moderate posterior cerebral artery stenosis    Past Surgical History  Procedure Laterality Date  . Doppler echocardiography      renal artery and they were within normal limits  . Cardiovascular stress test      within normal limits EF of 54%    No family history on file.  History   Social History  . Marital Status: Married    Spouse Name: N/A    Number of Children: N/A  . Years of Education: N/A   Occupational History  . Not on file.   Social History Main Topics  . Smoking status: Former Research scientist (life sciences)  . Smokeless tobacco: Not on file  . Alcohol Use: No  . Drug Use: Not on file  . Sexual Activity: Not on file   Other Topics Concern  . Not on file   Social History Narrative  . No narrative on file    Review of  systems: Orthostatic dizziness, unsteady gait, frequency and urgency, headaches The patient specifically denies any chest pain at rest or with exertion, dyspnea at rest or with exertion, orthopnea, paroxysmal nocturnal dyspnea, syncope, palpitations, focal neurological deficits, intermittent claudication, lower extremity edema, unexplained weight gain, cough, hemoptysis or wheezing.  The patient also denies abdominal pain, nausea, vomiting, dysphagia, diarrhea, constipation, polyuria, polydipsia, dysuria, hematuria, frequency, urgency, abnormal bleeding or bruising, fever, chills, unexpected weight changes, mood swings, change in skin or hair texture, change in voice quality, auditory or visual problems, allergic reactions or rashes, new musculoskeletal complaints other than usual "aches and pains".   PHYSICAL EXAM BP 168/88  Pulse 67  Resp 16  Ht 5' 10" (1.778 m)  Wt 113.581 kg (250 lb 6.4 oz)  BMI 35.93 kg/m2 When rechecked his blood pressure was still high at 158/88 mm Hg General: Alert, oriented x3, no distress  Head: no evidence of trauma, PERRL, EOMI, no exophtalmos or lid lag, no myxedema, no xanthelasma; normal ears, nose and oropharynx  Neck: normal jugular venous pulsations and no hepatojugular reflux; brisk carotid pulses without delay and no carotid bruits  Chest: clear to auscultation, no signs of consolidation by percussion or palpation, normal fremitus, symmetrical and full respiratory excursions  Cardiovascular: normal position and quality of the apical impulse, regular rhythm, normal first and second heart sounds, no murmurs, rubs, +ve S4  Abdomen: no tenderness or distention, no masses by palpation, no abnormal pulsatility or arterial bruits, normal bowel sounds, no hepatosplenomegaly  Extremities: no clubbing, cyanosis or edema; 2+ radial, ulnar and brachial pulses bilaterally; 2+ right femoral, posterior tibial and dorsalis pedis pulses; 2+ left femoral, posterior tibial and  dorsalis pedis pulses; no subclavian or femoral bruits  Neurological: grossly nonfocal   EKG: Normal sinus rhythm with first degree AV block (PR 266 ms), nonspecific lateral ST-T-segment changes, QTC 462 ms  Lipid Panel  No results found for this basename: chol, trig, hdl, cholhdl, vldl, ldlcalc, ldldirect    BMET    Component Value Date/Time   NA 140 08/05/2010 0410   K 4.3 08/05/2010 0410   CL 109 08/05/2010 0410   CO2 24 08/05/2010 0410   GLUCOSE 127* 08/05/2010 0410   BUN 19 08/05/2010 0410   CREATININE 1.33 08/05/2010 0410   CALCIUM 8.2* 08/05/2010 0410   GFRNONAA 53* 08/05/2010 0410   GFRAA  Value: >60        The eGFR has been calculated using the MDRD equation. This calculation has not been validated  in all clinical situations. eGFR's persistently <60 mL/min signify possible Chronic Kidney Disease. 08/05/2010 0410     ASSESSMENT AND PLAN OSA (obstructive sleep apnea)  He did not tolerate CPAP when he tried in the past and is not willing to try again  HTN (hypertension)  His blood pressure is elevated and this may explain his headaches. In the past he took for right middle, but this was stopped (I think because of the first degree A-V block). We'll try low-dose amlodipine. I wonder whether his orthostatic dizziness and gait difficulties might be related to alpha blocker induced orthostatic hypotension. Stop Hytrin and try Flomax. Hyperlipidemia  I don't have his most recent results, but will get him from Dr. Arelia Sneddon. His residual problem has always been a low HDL cholesterol. Additional weight loss and better glycemic control will help to improve this number.  CKD (chronic kidney disease) stage 2, GFR 60-89 ml/min  Stable renal function.  DM2 (diabetes mellitus, type 2)  There has been marked deterioration in glycemic control, I think this is attributable to less attention to diet. Severe obesity (BMI 35.0-39.9)  He has gained some weight since his last appointment. He  eats out a lot and may be getting more sodium than he expects. He does not add salt to his food. These may be the causes for deterioration in blood pressure control.  Stenosis of posterior cerebral artery  No neurological complaints. On clopidogrel since 2004 secondary to a transient ischemic attack.  Meds ordered this encounter  Medications  . glimepiride (AMARYL) 4 MG tablet    Sig: Take 4 mg by mouth daily. Take 1 and 1/2 tablet  . Probiotic Product (PROBIOTIC DAILY PO)    Sig: Take 1 capsule by mouth daily.    Holli Humbles, MD, [place] [phone] office [phone] pager

## 2014-06-19 NOTE — Patient Instructions (Signed)
Your physician has recommended you make the following change in your medication: stop the terazosin (hytrin). Start new prescriptions for amlodipine and tamsulosin. These have already been sent to your pharmacy.   Your physician recommends that you schedule a follow-up appointment in: 4-6 weeks with Dr. Sallyanne Kuster.

## 2014-06-29 ENCOUNTER — Other Ambulatory Visit: Payer: Self-pay | Admitting: Cardiovascular Disease

## 2014-07-01 NOTE — Telephone Encounter (Signed)
Request for benazepril sent to pharmacy electronically. Request for Omeprazole routed to pharmacist, K Alvstad, to advise due to Clopidogrel-Omeprazole interaction.

## 2014-07-02 ENCOUNTER — Other Ambulatory Visit: Payer: Self-pay | Admitting: Pharmacist Clinician (PhC)/ Clinical Pharmacy Specialist

## 2014-07-02 MED ORDER — PANTOPRAZOLE SODIUM 40 MG PO TBEC: 40.0000 mg | DELAYED_RELEASE_TABLET | Freq: Every day | ORAL | Status: DC

## 2014-07-05 ENCOUNTER — Other Ambulatory Visit: Payer: Self-pay | Admitting: Cardiovascular Disease

## 2014-07-05 NOTE — Telephone Encounter (Signed)
Rx was sent to pharmacy electronically. 

## 2014-07-08 ENCOUNTER — Other Ambulatory Visit: Payer: Self-pay | Admitting: Cardiovascular Disease

## 2014-07-09 NOTE — Telephone Encounter (Signed)
Rx was sent to pharmacy electronically. 

## 2014-08-05 ENCOUNTER — Other Ambulatory Visit: Payer: Self-pay | Admitting: Cardiovascular Disease

## 2014-08-05 NOTE — Telephone Encounter (Signed)
Rx was sent to pharmacy electronically. 

## 2014-10-11 ENCOUNTER — Other Ambulatory Visit: Payer: Self-pay | Admitting: Cardiovascular Disease

## 2014-10-11 NOTE — Telephone Encounter (Signed)
Rx refill sent to patient pharmacy   

## 2015-01-02 ENCOUNTER — Other Ambulatory Visit: Payer: Self-pay | Admitting: Cardiovascular Disease

## 2015-01-02 NOTE — Telephone Encounter (Signed)
Rx(s) sent to pharmacy electronically.  

## 2015-01-07 ENCOUNTER — Encounter: Payer: Self-pay | Admitting: *Deleted

## 2015-01-30 ENCOUNTER — Encounter: Payer: Self-pay | Admitting: Cardiovascular Disease

## 2015-04-14 ENCOUNTER — Other Ambulatory Visit: Payer: Self-pay | Admitting: Physician Assistant

## 2015-04-14 DIAGNOSIS — R51 Headache: Principal | ICD-10-CM

## 2015-04-14 DIAGNOSIS — R519 Headache, unspecified: Secondary | ICD-10-CM

## 2015-04-23 ENCOUNTER — Ambulatory Visit
Admission: RE | Admit: 2015-04-23 | Discharge: 2015-04-23 | Disposition: A | Discharge status: Home / Self Care | Payer: Medicare Other | Source: Ambulatory Visit | Attending: Physician Assistant | Admitting: Physician Assistant

## 2015-04-23 DIAGNOSIS — R519 Headache, unspecified: Secondary | ICD-10-CM

## 2015-04-23 DIAGNOSIS — R51 Headache: Principal | ICD-10-CM

## 2015-05-05 ENCOUNTER — Encounter: Payer: Self-pay | Admitting: Diagnostic Neuroimaging

## 2015-05-05 ENCOUNTER — Ambulatory Visit (INDEPENDENT_AMBULATORY_CARE_PROVIDER_SITE_OTHER): Payer: Medicare Other | Admitting: Diagnostic Neuroimaging

## 2015-05-05 VITALS — BP 144/81 | HR 71 | Ht 70.0 in | Wt 252.4 lb

## 2015-05-05 DIAGNOSIS — R519 Headache, unspecified: Secondary | ICD-10-CM

## 2015-05-05 DIAGNOSIS — R51 Headache: Secondary | ICD-10-CM | POA: Diagnosis not present

## 2015-05-05 NOTE — Progress Notes (Signed)
[facility]  PATIENT: Micheal Holt DOB: 12-06-1940  REFERRING CLINICIAN: Gwyneth Revels HISTORY FROM: patient REASON FOR VISIT: new consult    HISTORICAL  CHIEF COMPLAINT:  Chief Complaint  Patient presents with  . Headache    rm 7, New Patient    HISTORY OF PRESENT ILLNESS:   74 year old right-handed male here for evaluation of headache. Patient has history of hypertension and diabetes.  For past 5 days patient has had new onset of global dull constant pressure headache with nausea and intermittent vomiting. No photophobia or phonophobia. Headaches are worse in the morning and slightly better later in the day. Headaches have been unremitting for past 5 weeks.  No prior history of headaches or migraine. No family history of migraine. Patient denies alcohol tobacco or illicit drug use. No caffeine use.    REVIEW OF SYSTEMS: Full 14 system review of systems performed and notable only for headache.  ALLERGIES: No Known Allergies  HOME MEDICATIONS: Outpatient Prescriptions Prior to Visit  Medication Sig Dispense Refill  . amLODipine (NORVASC) 5 MG tablet TAKE 1 TABLET BY MOUTH DAILY 30 tablet 5  . aspirin 81 MG tablet Take 81 mg by mouth daily.    Marland Kitchen atorvastatin (LIPITOR) 20 MG tablet Take 1 tablet (20 mg total) by mouth daily. 30 tablet 10  . benazepril (LOTENSIN) 40 MG tablet TAKE 1 TABLET BY MOUTH ONCE DAILY FOR HIGH BLOOD PRESSURE 30 tablet 11  . clopidogrel (PLAVIX) 75 MG tablet TAKE 1 TABLET BY MOUTH DAILY 30 tablet 10  . furosemide (LASIX) 40 MG tablet TAKE 1 TABLET BY MOUTH DAILY, MAY INCREASE TO 2 TABLETS DAILY IF WEIGHT INCREASES TO MORE THAN 2 POUNDS FROM BASELINE 45 tablet 8  . glimepiride (AMARYL) 4 MG tablet Take 4 mg by mouth daily. Take 1 and 1/2 tablet    . metFORMIN (GLUCOPHAGE) 1000 MG tablet Take 1,000 mg by mouth 2 (two) times daily with a meal.    . omeprazole (PRILOSEC) 40 MG capsule TAKE 1 CAPSULE BY MOUTH ONCE DAILY 30 capsule 10  .  pantoprazole (PROTONIX) 40 MG tablet Take 1 tablet (40 mg total) by mouth daily. 30 tablet 11  . Potassium Chloride Crys CR (KLOR-CON M20 PO) Take 20 mEq by mouth 2 (two) times daily.    . solifenacin (VESICARE) 10 MG tablet Take 10 mg by mouth daily.    . tamsulosin (FLOMAX) 0.4 MG CAPS capsule TAKE 1 CAPSULE BY MOUTH EVERY NIGHT AT BEDTIME 30 capsule 5  . Probiotic Product (PROBIOTIC DAILY PO) Take 1 capsule by mouth daily.     No facility-administered medications prior to visit.    PAST MEDICAL HISTORY: Past Medical History  Diagnosis Date  . Chronic diastolic CHF (congestive heart failure), NYHA class 1     currently euvolemic  . Systemic hypertension   . Obstructive sleep apnea     he refused to use cpap  . Obesity     moderate to severe  . DM (diabetes mellitus)     type 2  . Chronic kidney disease (CKD), stage II (mild)   . Chest pain     most likely Gastroesophageal reflux  . TIA (transient ischemic attack)     in Feb of 2004  . Cerebrovascular disease     by MRA in Feb 2004 with moderate posterior cerebral artery stenosis  . Dyspnea   . Dyslipidemia 2009  . Hyperlipidemia     PAST SURGICAL HISTORY: Past Surgical History  Procedure Laterality  Date  . Doppler echocardiography      renal artery and they were within normal limits  . Cardiovascular stress test      within normal limits EF of 54%    FAMILY HISTORY: Family History  Problem Relation Age of Onset  . Stroke Mother   . Heart failure Father   . Diabetes type II Father   . Cancer Sister   . Arthritis Maternal Grandmother   . Heart failure Maternal Grandfather   . Diabetes type I Paternal Grandmother   . Dementia Paternal Grandfather   . Heart failure Paternal Grandfather     SOCIAL HISTORY:  Social History   Social History  . Marital Status: Married    Spouse Name: N/A  . Number of Children: 2  . Years of Education: masters    Occupational History  . Not on file.   Social History Main  Topics  . Smoking status: Former Research scientist (life sciences)  . Smokeless tobacco: Never Used  . Alcohol Use: No  . Drug Use: No  . Sexual Activity: Not on file   Other Topics Concern  . Not on file   Social History Narrative   Married,    Caffeine use- none     PHYSICAL EXAM  GENERAL EXAM/CONSTITUTIONAL: Vitals:  Filed Vitals:   05/05/15 1250  BP: 144/81  Pulse: 71  Height: '5\' 10"'  (1.778 m)  Weight: 252 lb 6.4 oz (114.488 kg)     Body mass index is 36.22 kg/(m^2).  Visual Acuity Screening   Right eye Left eye Both eyes  Without correction:     With correction: 20/100 20/40   Comments: 05/05/15 bi focals    Patient is in no distress; well developed, nourished and groomed; neck is supple  CARDIOVASCULAR:  Examination of carotid arteries is normal; no carotid bruits  Regular rate and rhythm, no murmurs  Examination of peripheral vascular system by observation and palpation is normal  DISHEVELED APPEARANCE; MULTIPLE FOOD STAINS ON SHIRT AND PANTS  EYES:  Ophthalmoscopic exam of optic discs and posterior segments is normal; no papilledema or hemorrhages  MUSCULOSKELETAL:  Gait, strength, tone, movements noted in Neurologic exam below  NEUROLOGIC: MENTAL STATUS:  No flowsheet data found.  awake, alert, oriented to person, place and time  recent and remote memory intact  normal attention and concentration  language fluent, comprehension intact, naming intact,   fund of knowledge appropriate  CRANIAL NERVE:   2nd - no papilledema on fundoscopic exam  2nd, 3rd, 4th, 6th - pupils equal and reactive to light, visual fields full to confrontation, extraocular muscles intact, no nystagmus  5th - facial sensation symmetric  7th - facial strength symmetric  8th - hearing intact  9th - palate elevates symmetrically, uvula midline  11th - shoulder shrug symmetric  12th - tongue protrusion midline  MOTOR:   normal bulk and tone, full strength in the BUE,  BLE  SENSORY:   normal and symmetric to light touch, temperature, ABSENT VIB AT TOES  COORDINATION:   finger-nose-finger, fine finger movements normal  REFLEXES:   deep tendon reflexes TRACE and symmetric  GAIT/STATION:   narrow based gait; STOOPED POSTURE, romberg is negative    DIAGNOSTIC DATA (LABS, IMAGING, TESTING) - I reviewed patient records, labs, notes, testing and imaging myself where available.  Lab Results  Component Value Date   WBC 5.4 08/05/2010   HGB 10.0* 08/05/2010   HCT 30.7* 08/05/2010   MCV 85.3 08/05/2010   PLT 225 08/05/2010  Component Value Date/Time   NA 140 08/05/2010 0410   K 4.3 08/05/2010 0410   CL 109 08/05/2010 0410   CO2 24 08/05/2010 0410   GLUCOSE 127* 08/05/2010 0410   BUN 19 08/05/2010 0410   CREATININE 1.33 08/05/2010 0410   CALCIUM 8.2* 08/05/2010 0410   PROT 5.5* 07/05/2010 2209   ALBUMIN 3.3* 07/05/2010 2209   AST 20 07/05/2010 2209   ALT 18 07/05/2010 2209   ALKPHOS 101 07/05/2010 2209   BILITOT 0.5 07/05/2010 2209   GFRNONAA 53* 08/05/2010 0410   GFRAA  08/05/2010 0410    >60        The eGFR has been calculated using the MDRD equation. This calculation has not been validated in all clinical situations. eGFR's persistently <60 mL/min signify possible Chronic Kidney Disease.   No results found for: CHOL, HDL, LDLCALC, LDLDIRECT, TRIG, CHOLHDL No results found for: HGBA1C No results found for: VITAMINB12 No results found for: TSH   04/23/15 MRI brain [I reviewed images myself and agree with interpretation. -VRP]  - Marked BILATERAL ventricular prominence of uncertain significance, but favor global atrophy. Deep white matter hyperintensity is both focal and confluent, and favored to represent chronic microvascular ischemic change. No acute intracranial findings.    ASSESSMENT AND PLAN  74 y.o. year old male here with daily headaches since March 2016, although per cardiology notes, headaches have been  there since at least Oct 2015.    Ddx: Chronic daily headache - Plan: Sedimentation Rate, C-reactive Protein - likely due to uncontrolled hypertension, diabetes, untreated sleep apnea - other possible causes would include pseudotumor cerebri or temporal arteritis   PLAN: - check ESR, CRP to rule out temporal arteritis - check lumbar puncture to measure opening pressure for pseudotumor cerebri evaluation - encouraged patient to resume CPAP therapy for sleep apnea; would likely need new sleep study since last evaluation was more than 4 years ago; patient is not sure he wants to try this - encouraged hydration, proper nutrition, exercise - follow up with PCP re: HTN and diabetes control  Orders Placed This Encounter  Procedures  . Sedimentation Rate  . C-reactive Protein   Return in about 3 months (around 08/05/2015).    Penni Bombard, MD 5/94/7076, 1:51 PM Certified in Neurology, Neurophysiology and Neuroimaging  [facility] [address], [address], [place] [address] [phone]

## 2015-05-05 NOTE — Patient Instructions (Signed)
I will check lab testing and lumbar puncture.  Consider repeat sleep study to help resume CPAP for sleep apnea.  Use tylenol as needed for headaches.

## 2015-05-06 LAB — SEDIMENTATION RATE: SED RATE: 2 mm/h (ref 0–30)

## 2015-05-06 LAB — C-REACTIVE PROTEIN: CRP: 1 mg/L (ref 0.0–4.9)

## 2015-05-07 ENCOUNTER — Telehealth: Payer: Self-pay | Admitting: *Deleted

## 2015-05-07 NOTE — Telephone Encounter (Signed)
-----   Message from Penni Bombard, MD sent at 05/07/2015  8:32 AM EDT ----- pls call with normal labs.

## 2015-05-07 NOTE — Telephone Encounter (Signed)
Spoke with patient and per Dr Leta Baptist, informed him his lab results are normal. He verbalized understanding, appreciation.

## 2015-05-10 ENCOUNTER — Other Ambulatory Visit: Payer: Self-pay | Admitting: Cardiovascular Disease

## 2015-05-16 ENCOUNTER — Other Ambulatory Visit: Payer: Self-pay | Admitting: Cardiovascular Disease

## 2015-05-16 NOTE — Telephone Encounter (Signed)
Rx(s) sent to pharmacy electronically.  

## 2015-05-20 ENCOUNTER — Telehealth: Payer: Self-pay | Admitting: Diagnostic Neuroimaging

## 2015-05-20 NOTE — Telephone Encounter (Signed)
Pt called and is wonder if he needs a LP. Says during his last office visit he was told he did. Please call and advise [phone]

## 2015-05-20 NOTE — Telephone Encounter (Addendum)
I called and LMVM for [facility] [phone] (spine services), to let them know about pts test order.  I also called pt and gave him [phone] to call and schedule.  Looks like order faxed over 05-15-15 to jennifer at [facility]  (on order).

## 2015-05-26 ENCOUNTER — Telehealth: Payer: Self-pay | Admitting: Diagnostic Neuroimaging

## 2015-05-26 ENCOUNTER — Encounter: Payer: Self-pay | Admitting: Cardiovascular Disease

## 2015-05-26 ENCOUNTER — Telehealth: Payer: Self-pay | Admitting: Cardiovascular Disease

## 2015-05-26 NOTE — Telephone Encounter (Signed)
error 

## 2015-05-26 NOTE — Telephone Encounter (Signed)
Need clearance from cardiology re: plavix. Then proceed with LP. -VRP

## 2015-05-26 NOTE — Telephone Encounter (Signed)
Received call from [place], Rudy stating they have not gotten information re: stopping Plavix prior to patient's LP. She is inquiring what Dr Leta Baptist would like to do. Transferred her call to Dr Leta Baptist.

## 2015-05-26 NOTE — Telephone Encounter (Signed)
Shewants to know if you received fax on 05-20-15 to stop his Plavix for a procedure. If so, please fax back asap.

## 2015-05-28 ENCOUNTER — Telehealth: Payer: Self-pay | Admitting: *Deleted

## 2015-05-28 NOTE — Telephone Encounter (Signed)
Cardiac clearance letter faxed to [facility] with OK to hold Plavix 5 days prior to Lumbar puncture.

## 2015-05-28 NOTE — Telephone Encounter (Signed)
Dr. Loletha Grayer sent letter through [facility] 05/26/15.  Consent faxed with second request 05/28/15 to [facility].  OK to hold Plavix 5 days prior to Lumbar Puncture.

## 2015-06-02 ENCOUNTER — Other Ambulatory Visit: Payer: Self-pay | Admitting: Cardiovascular Disease

## 2015-06-02 NOTE — Telephone Encounter (Signed)
Rx request sent to pharmacy.  

## 2015-06-03 ENCOUNTER — Ambulatory Visit
Admission: RE | Admit: 2015-06-03 | Discharge: 2015-06-03 | Disposition: A | Discharge status: Home / Self Care | Payer: Medicare Other | Source: Ambulatory Visit | Attending: Diagnostic Neuroimaging | Admitting: Diagnostic Neuroimaging

## 2015-06-03 ENCOUNTER — Other Ambulatory Visit: Payer: Self-pay | Admitting: Diagnostic Neuroimaging

## 2015-06-03 DIAGNOSIS — R51 Headache: Principal | ICD-10-CM

## 2015-06-03 DIAGNOSIS — R519 Headache, unspecified: Secondary | ICD-10-CM

## 2015-06-03 LAB — GLUCOSE, CSF: GLUCOSE CSF: 109 mg/dL — AB (ref 43–76)

## 2015-06-03 LAB — CSF CELL COUNT WITH DIFFERENTIAL
RBC Count, CSF: 790 cu mm — ABNORMAL HIGH
TUBE #: 3
WBC, CSF: 3 cu mm (ref 0–5)

## 2015-06-03 LAB — PROTEIN, CSF: TOTAL PROTEIN, CSF: 98 mg/dL — AB (ref 15–45)

## 2015-06-03 NOTE — Progress Notes (Signed)
Dr. Jobe Igo brought wife in to explain discharge instructions and to report that LP went fine.

## 2015-06-03 NOTE — Discharge Instructions (Signed)
Lumbar Puncture Discharge Instructions  1. Go home and rest quietly for the next 24 hours.  It is important to lie flat for the next 24 hours.  Get up only to go to the restroom.  You may lie in the bed or on a couch on your back, your stomach, your left side or your right side.  You may have one pillow under your head.  You may have pillows between your knees while you are on your side or under your knees while you are on your back.  2. DO NOT drive today.  Recline the seat as far back as it will go, while still wearing your seat belt, on the way home.  3. You may get up to go to the bathroom as needed.  You may sit up for 10 minutes to eat.  You may resume your normal diet and medications unless otherwise indicated.  Drink lots of extra fluids today and tomorrow.  4. The incidence of headache, nausea, or vomiting is about 5% (one in 20 patients).  If you develop a headache, lie flat and drink plenty of fluids until the headache goes away.  Caffeinated beverages may be helpful.  If you develop severe nausea and vomiting or a headache that does not go away with flat bed rest, call the physician who sent you here.   5. You may resume normal activities after your 24 hours of bed rest is over; however, do not exert yourself strongly or do any heavy lifting tomorrow.  6. Call your physician for a follow-up appointment.   7. If you have any questions  after you arrive home, please call [phone].  Discharge instructions have been explained to the patient.  The patient, or the person responsible for the patient, fully understands these instructions.   MAY RESUME PLAVIX TODAY.

## 2015-06-05 ENCOUNTER — Other Ambulatory Visit: Payer: Self-pay | Admitting: Cardiovascular Disease

## 2015-06-05 NOTE — Telephone Encounter (Signed)
REFILL 

## 2015-06-06 LAB — CSF CULTURE
GRAM STAIN: NONE SEEN
GRAM STAIN: NONE SEEN

## 2015-06-06 LAB — CSF CULTURE W GRAM STAIN: Organism ID, Bacteria: NO GROWTH

## 2015-06-18 ENCOUNTER — Telehealth: Payer: Self-pay | Admitting: *Deleted

## 2015-06-18 DIAGNOSIS — G4733 Obstructive sleep apnea (adult) (pediatric): Secondary | ICD-10-CM

## 2015-06-18 DIAGNOSIS — I10 Essential (primary) hypertension: Secondary | ICD-10-CM

## 2015-06-18 DIAGNOSIS — G44209 Tension-type headache, unspecified, not intractable: Secondary | ICD-10-CM

## 2015-06-18 NOTE — Telephone Encounter (Signed)
Spoke with wife and informed her of patient's results, per Dr Leta Baptist. She expressed concern over his continuing headaches and possible cause.  Inquired if he is now using CPAP; she stated no, that it was "awful years ago when he tried to use it". Informed her that there is most likely great advance in technology and that perhaps he can now use a new one with ease. She requests he be set up for sleep study.  Informed her that controlling his diabetes and BP will be very helpful with his headaches. She states "he keeps a close eye on his sugar". Inquired when he last saw his PCP; she is unsure. Informed her that he needs regular check up with PCP re: diabetes control. Also informed her that per Epic record, his BP on 06/03/15 was very high, checked twice. Also informed her that his record shows he has not seen Dr Sallyanne Kuster, his cardiologist in the past year and doesn't have FU scheduled with him. Advised that his high BP is not only a probable cause of HA, but can lead to cardiac event and/or stroke. Strongly advised that he FU with cardiologist and PCP. She verbalized understanding, appreciation and stated she would make appointments.

## 2015-06-18 NOTE — Telephone Encounter (Signed)
Will order sleep study. -VRP

## 2015-06-18 NOTE — Telephone Encounter (Signed)
-----   Message from Penni Bombard, MD sent at 06/18/2015  9:54 AM EDT ----- High CSF glucose and protein likely related to diabetes. No infection. -VRP

## 2015-06-28 ENCOUNTER — Other Ambulatory Visit: Payer: Self-pay | Admitting: Cardiovascular Disease

## 2015-06-30 ENCOUNTER — Other Ambulatory Visit: Payer: Self-pay

## 2015-06-30 ENCOUNTER — Other Ambulatory Visit: Payer: Self-pay | Admitting: Cardiovascular Disease

## 2015-06-30 MED ORDER — BENAZEPRIL HCL 40 MG PO TABS: 40.0000 mg | ORAL_TABLET | Freq: Every day | ORAL | Status: DC

## 2015-07-18 ENCOUNTER — Ambulatory Visit (INDEPENDENT_AMBULATORY_CARE_PROVIDER_SITE_OTHER): Payer: Medicare Other | Admitting: Cardiovascular Disease

## 2015-07-18 ENCOUNTER — Encounter: Payer: Self-pay | Admitting: Cardiovascular Disease

## 2015-07-18 VITALS — BP 120/66 | HR 79 | Ht 70.0 in | Wt 249.0 lb

## 2015-07-18 DIAGNOSIS — G4733 Obstructive sleep apnea (adult) (pediatric): Secondary | ICD-10-CM

## 2015-07-18 DIAGNOSIS — N182 Chronic kidney disease, stage 2 (mild): Secondary | ICD-10-CM | POA: Diagnosis not present

## 2015-07-18 DIAGNOSIS — I5032 Chronic diastolic (congestive) heart failure: Secondary | ICD-10-CM

## 2015-07-18 DIAGNOSIS — I10 Essential (primary) hypertension: Secondary | ICD-10-CM

## 2015-07-18 NOTE — Patient Instructions (Signed)
Dr. Croitoru recommends that you schedule a follow-up appointment in: ONE YEAR   

## 2015-07-18 NOTE — Progress Notes (Signed)
Patient ID: Micheal Holt, male   DOB: 20-Nov-1940, 74 y.o.   MRN: [id number]     Cardiology Office Note   Date:  07/19/2015   ID:  Micheal Holt, DOB 07/19/1941, MRN [id number]  PCP:  Micheal Downing, MD  Cardiologist:   Micheal Klein, MD   Chief Complaint  Patient presents with  . Follow-up    having headaches; no chest pain, no shortness of breath, has constant edema, no pain in legs, no cramping in legs, no lightheadedness, no dizziness      History of Present Illness: Micheal Holt is a 74 y.o. male who presents for  Follow-up for hypertension, dyslipidemia, chronic diastolic heart failure and obstructive sleep apnea.  Micheal Holt had problems with severe daily headaches all summer long , but these are now better. He saw Dr. Leta Holt, had MRI (atrophy, chronic microvascular changes) and labs (normal ESR, CRP).  Dr. Reeves Holt recommended that he restart treatment for his obstructive sleep apnea since he had not been wearing CPAP.  He had lab 6 weeks ago with his primary care physician but I don't have those reports..   he remains very sedentary but denies problems with exertional dyspnea or angina and has not had palpitations, syncope or any worsening of his mild chronic ankle swelling. Other than the headaches, he has not had focal neurological problems.  He has a long standing history of obesity, type 2 diabetes mellitus, hypertension, mild chronic kidney disease, hyperlipidemia. He has obstructive sleep apnea but is not compliant with CPAP. He has a remote history of a transient ischemic attack and has been taking clopidogrel for this since 2004. He is known to have stenosis of a posterior cerebral artery.  He has had intermittent problems with chest discomfort, not currently an issue. These seemed to respond to treatment with a proton pump inhibitor. He had a normal nuclear stress test in 2009 (diaphragmatic attenuation artifact) and has normal left ventricular systolic function  with mild diastolic dysfunction by echo in 2012. His left atrium is mild to moderately dilated.  Past Medical History  Diagnosis Date  . Chronic diastolic CHF (congestive heart failure), NYHA class 1 ([place])     currently euvolemic  . Systemic hypertension   . Obstructive sleep apnea     he refused to use cpap  . Obesity     moderate to severe  . DM (diabetes mellitus) ([place])     type 2  . Chronic kidney disease (CKD), stage II (mild)   . Chest pain     most likely Gastroesophageal reflux  . TIA (transient ischemic attack)     in Feb of 2004  . Cerebrovascular disease     by MRA in Feb 2004 with moderate posterior cerebral artery stenosis  . Dyspnea   . Dyslipidemia 2009  . Hyperlipidemia     Past Surgical History  Procedure Laterality Date  . Doppler echocardiography      renal artery and they were within normal limits  . Cardiovascular stress test      within normal limits EF of 54%     Current Outpatient Prescriptions  Medication Sig Dispense Refill  . amLODipine (NORVASC) 5 MG tablet TAKE 1 TABLET BY MOUTH DAILY 30 tablet 0  . aspirin 81 MG tablet Take 81 mg by mouth daily.    Marland Kitchen atorvastatin (LIPITOR) 20 MG tablet TAKE ONE (1) TABLET BY MOUTH EVERY DAY 30 tablet 0  . benazepril (LOTENSIN) 40 MG tablet Take 1 tablet (40  mg total) by mouth daily. for high blood pressure 30 tablet 3  . clopidogrel (PLAVIX) 75 MG tablet TAKE 1 TABLET BY MOUTH DAILY 30 tablet 0  . furosemide (LASIX) 40 MG tablet TAKE 1 TABLET BY MOUTH DAILY, MAY INCREASE TO 2 TABLETS DAILY IF WEIGHT INCREASES TO MORE THAN 2 POUNDS FROM BASELINE 45 tablet 0  . glimepiride (AMARYL) 4 MG tablet Take 4 mg by mouth daily. Take 1 and 1/2 tablet    . metFORMIN (GLUCOPHAGE) 1000 MG tablet Take 1,000 mg by mouth 2 (two) times daily with a meal.    . omeprazole (PRILOSEC) 40 MG capsule Take 1 capsule (40 mg total) by mouth daily. NEED OV. 30 capsule 0  . pantoprazole (PROTONIX) 40 MG tablet TAKE 1 TABLET BY MOUTH DAILY  30 tablet 0  . Potassium Chloride Crys CR (KLOR-CON M20 PO) Take 20 mEq by mouth 2 (two) times daily.    . solifenacin (VESICARE) 10 MG tablet Take 10 mg by mouth daily.    . tamsulosin (FLOMAX) 0.4 MG CAPS capsule TAKE 1 CAPSULE BY MOUTH EVERY NIGHT AT BEDTIME 30 capsule 0  . traMADol (ULTRAM) 50 MG tablet      No current facility-administered medications for this visit.    Allergies:   Review of patient's allergies indicates no known allergies.    Social History:  The patient  reports that he has quit smoking. He has never used smokeless tobacco. He reports that he does not drink alcohol or use illicit drugs.   Family History:  The patient's family history includes Arthritis in his maternal grandmother; Cancer in his sister; Dementia in his paternal grandfather; Diabetes type I in his paternal grandmother; Diabetes type II in his father; Heart failure in his father, maternal grandfather, and paternal grandfather; Stroke in his mother.    ROS:  Please see the history of present illness.    Otherwise, review of systems positive for none.   All other systems are reviewed and negative.    PHYSICAL EXAM: VS:  BP 120/66 mmHg  Pulse 79  Ht '5\' 10"'  (1.778 m)  Wt 249 lb (112.946 kg)  BMI 35.73 kg/m2 , BMI Body mass index is 35.73 kg/(m^2).  General: Alert, oriented x3, no distress Head: no evidence of trauma, PERRL, EOMI, no exophtalmos or lid lag, no myxedema, no xanthelasma; normal ears, nose and oropharynx Neck: normal jugular venous pulsations and no hepatojugular reflux; brisk carotid pulses without delay and no carotid bruits Chest: clear to auscultation, no signs of consolidation by percussion or palpation, normal fremitus, symmetrical and full respiratory excursions Cardiovascular: normal position and quality of the apical impulse, regular rhythm, normal first and second heart sounds, no murmurs, rubs or gallops Abdomen: no tenderness or distention, no masses by palpation, no  abnormal pulsatility or arterial bruits, normal bowel sounds, no hepatosplenomegaly Extremities: no clubbing, cyanosis or edema; 2+ radial, ulnar and brachial pulses bilaterally; 2+ right femoral, posterior tibial and dorsalis pedis pulses; 2+ left femoral, posterior tibial and dorsalis pedis pulses; no subclavian or femoral bruits Neurological: grossly nonfocal Psych: euthymic mood, full affect   EKG:  EKG is ordered today. The ekg ordered today demonstrates  Sinus rhythm with first-degree AV block and nonspecific ST-T changes   Recent Labs: No results found for requested labs within last 365 days.    Lipid Panel No results found for: CHOL, TRIG, HDL, CHOLHDL, VLDL, LDLCALC, LDLDIRECT    Wt Readings from Last 3 Encounters:  07/18/15 249 lb (112.946 kg)  05/05/15 252 lb 6.4 oz (114.488 kg)  06/19/14 250 lb 6.4 oz (113.581 kg)      Other studies Reviewed: Additional studies/ records that were reviewed today include:  Labs, imaging studies, neurology office visit notes.   ASSESSMENT AND PLAN:  1.  Essential hypertension, well controlled 2.  Obstructive sleep apnea noncompliant with CPAP, use of CPAP is again recommended. 3.  Obesity complicated by hyperlipidemia and type 2 diabetes mellitus 4. Chronic kidney disease stage II-3 5.  Remote history of TIA on clopidogrel , and recent headaches without evidence of new neurological events but with evidence of bilateral atrophy and microvascular disease 6. Prostatism with symptoms of orthostatic hypotension on Hytrin, better on Flomax   Current medicines are reviewed at length with the patient today.  The patient does not have concerns regarding medicines.  The following changes have been made:  no change  Labs/ tests ordered today include:  No orders of the defined types were placed in this encounter.    Patient Instructions  Dr. Sallyanne Kuster recommends that you schedule a follow-up appointment in: ONE  YEAR       SignedSanda Klein, MD  07/19/2015 9:36 AM    Micheal Klein, MD, [facility] [phone] office [phone] pager

## 2015-07-19 DIAGNOSIS — I5032 Chronic diastolic (congestive) heart failure: Secondary | ICD-10-CM | POA: Insufficient documentation

## 2015-07-25 ENCOUNTER — Encounter: Payer: Self-pay | Admitting: Cardiovascular Disease

## 2015-07-29 ENCOUNTER — Other Ambulatory Visit: Payer: Self-pay | Admitting: Cardiovascular Disease

## 2015-08-18 ENCOUNTER — Encounter: Payer: Self-pay | Admitting: Diagnostic Neuroimaging

## 2015-08-18 ENCOUNTER — Encounter (INDEPENDENT_AMBULATORY_CARE_PROVIDER_SITE_OTHER): Payer: Self-pay

## 2015-08-18 ENCOUNTER — Ambulatory Visit (INDEPENDENT_AMBULATORY_CARE_PROVIDER_SITE_OTHER): Payer: Medicare Other | Admitting: Diagnostic Neuroimaging

## 2015-08-18 VITALS — BP 139/81 | HR 86 | Ht 70.0 in | Wt 255.8 lb

## 2015-08-18 DIAGNOSIS — G44209 Tension-type headache, unspecified, not intractable: Secondary | ICD-10-CM

## 2015-08-18 DIAGNOSIS — G4733 Obstructive sleep apnea (adult) (pediatric): Secondary | ICD-10-CM | POA: Diagnosis not present

## 2015-08-18 NOTE — Progress Notes (Signed)
[facility]  PATIENT: Micheal Holt DOB: 11/27/1940  REFERRING CLINICIAN: Gwyneth Revels HISTORY FROM: patient REASON FOR VISIT: follow up   HISTORICAL  CHIEF COMPLAINT:  Chief Complaint  Patient presents with  . Chronic daily headache    rm 7, "HA twice a week, not severe"  . Follow-up    4 month    HISTORY OF PRESENT ILLNESS:   UPDATE 08/18/15: Since last visit, doing much better. Headaches now 1-2 per week, but much improved. He cut out ice cream, diabetes meds adjusted, and blood sugars much improved. In turn, headaches much improved. LP showed normal opening pressure.   PRIOR HPI (05/05/15): 74 year old right-handed male here for evaluation of headache. Patient has history of hypertension and diabetes. For past 5 days patient has had new onset of global dull constant pressure headache with nausea and intermittent vomiting. No photophobia or phonophobia. Headaches are worse in the morning and slightly better later in the day. Headaches have been unremitting for past 5 weeks. No prior history of headaches or migraine. No family history of migraine. Patient denies alcohol tobacco or illicit drug use. No caffeine use.    REVIEW OF SYSTEMS: Full 14 system review of systems performed and notable only for headache (mild).  ALLERGIES: No Known Allergies  HOME MEDICATIONS: Outpatient Prescriptions Prior to Visit  Medication Sig Dispense Refill  . amLODipine (NORVASC) 5 MG tablet TAKE 1 TABLET BY MOUTH DAILY 30 tablet 11  . aspirin 81 MG tablet Take 81 mg by mouth daily.    Marland Kitchen atorvastatin (LIPITOR) 20 MG tablet TAKE 1 TABLET BY MOUTH ONCE DAILY 30 tablet 11  . benazepril (LOTENSIN) 40 MG tablet Take 1 tablet (40 mg total) by mouth daily. for high blood pressure 30 tablet 3  . clopidogrel (PLAVIX) 75 MG tablet TAKE 1 TABLET BY MOUTH DAILY 30 tablet 11  . furosemide (LASIX) 40 MG tablet TAKE 1 TABLET BY MOUTH DAILY. MAY INCREASE TO 2 TABLETS DAILY IF WEIGHT INCREASES  TO MORE THAN 2 POUNDS FROM BASELINE 45 tablet 11  . glimepiride (AMARYL) 4 MG tablet Take 4 mg by mouth daily. Take 1 and 1/2 tablet    . metFORMIN (GLUCOPHAGE) 1000 MG tablet Take 1,000 mg by mouth 2 (two) times daily with a meal.    . omeprazole (PRILOSEC) 40 MG capsule Take 1 capsule (40 mg total) by mouth daily. NEED OV. 30 capsule 0  . pantoprazole (PROTONIX) 40 MG tablet TAKE 1 TABLET BY MOUTH DAILY 30 tablet 0  . Potassium Chloride Crys CR (KLOR-CON M20 PO) Take 20 mEq by mouth 2 (two) times daily.    . solifenacin (VESICARE) 10 MG tablet Take 10 mg by mouth daily.    . tamsulosin (FLOMAX) 0.4 MG CAPS capsule TAKE 1 CAPSULE BY MOUTH EVERY NIGHT AT BEDTIME 30 capsule 0  . traMADol (ULTRAM) 50 MG tablet      No facility-administered medications prior to visit.    PAST MEDICAL HISTORY: Past Medical History  Diagnosis Date  . Chronic diastolic CHF (congestive heart failure), NYHA class 1 ([place])     currently euvolemic  . Systemic hypertension   . Obstructive sleep apnea     he refused to use cpap  . Obesity     moderate to severe  . DM (diabetes mellitus) ([place])     type 2  . Chronic kidney disease (CKD), stage II (mild)   . Chest pain     most likely Gastroesophageal reflux  . TIA (  transient ischemic attack)     in Feb of 2004  . Cerebrovascular disease     by MRA in Feb 2004 with moderate posterior cerebral artery stenosis  . Dyspnea   . Dyslipidemia 2009  . Hyperlipidemia     PAST SURGICAL HISTORY: Past Surgical History  Procedure Laterality Date  . Doppler echocardiography      renal artery and they were within normal limits  . Cardiovascular stress test      within normal limits EF of 54%    FAMILY HISTORY: Family History  Problem Relation Age of Onset  . Stroke Mother   . Heart failure Father   . Diabetes type II Father   . Cancer Sister   . Arthritis Maternal Grandmother   . Heart failure Maternal Grandfather   . Diabetes type I Paternal Grandmother   .  Dementia Paternal Grandfather   . Heart failure Paternal Grandfather     SOCIAL HISTORY:  Social History   Social History  . Marital Status: Married    Spouse Name: N/A  . Number of Children: 2  . Years of Education: masters    Occupational History  . Not on file.   Social History Main Topics  . Smoking status: Former Research scientist (life sciences)  . Smokeless tobacco: Never Used  . Alcohol Use: No  . Drug Use: No  . Sexual Activity: Not on file   Other Topics Concern  . Not on file   Social History Narrative   Married,    Caffeine use- none     PHYSICAL EXAM  GENERAL EXAM/CONSTITUTIONAL: Vitals:  Filed Vitals:   08/18/15 1422  BP: 139/81  Pulse: 86  Height: '5\' 10"'  (1.778 m)  Weight: 255 lb 12.8 oz (116.03 kg)   Body mass index is 36.7 kg/(m^2). No exam data present  Patient is in no distress; well developed, nourished and groomed; neck is supple  CARDIOVASCULAR:  Examination of carotid arteries is normal; no carotid bruits  Regular rate and rhythm, no murmurs  Examination of peripheral vascular system by observation and palpation is normal  SLIGHTLY DISHEVELED APPEARANCE  EYES:  Ophthalmoscopic exam of optic discs and posterior segments is normal; no papilledema or hemorrhages  MUSCULOSKELETAL:  Gait, strength, tone, movements noted in Neurologic exam below  NEUROLOGIC: MENTAL STATUS:  No flowsheet data found.  awake, alert, oriented to person, place and time  recent and remote memory intact  normal attention and concentration  language fluent, comprehension intact, naming intact,   fund of knowledge appropriate  CRANIAL NERVE:   2nd - no papilledema on fundoscopic exam  2nd, 3rd, 4th, 6th - pupils equal and reactive to light, visual fields full to confrontation, extraocular muscles intact, no nystagmus  5th - facial sensation symmetric  7th - facial strength symmetric  8th - hearing intact  9th - palate elevates symmetrically, uvula  midline  11th - shoulder shrug symmetric  12th - tongue protrusion midline  MOTOR:   normal bulk and tone, full strength in the BUE, BLE  SENSORY:   normal and symmetric to light touch, temperature, ABSENT VIB AT TOES  COORDINATION:   finger-nose-finger, fine finger movements normal  REFLEXES:   deep tendon reflexes TRACE and symmetric  GAIT/STATION:   narrow based gait; STOOPED POSTURE, SHORT STEPS; romberg is negative    DIAGNOSTIC DATA (LABS, IMAGING, TESTING) - I reviewed patient records, labs, notes, testing and imaging myself where available.  Lab Results  Component Value Date   WBC 5.4 08/05/2010  HGB 10.0* 08/05/2010   HCT 30.7* 08/05/2010   MCV 85.3 08/05/2010   PLT 225 08/05/2010      Component Value Date/Time   NA 140 08/05/2010 0410   K 4.3 08/05/2010 0410   CL 109 08/05/2010 0410   CO2 24 08/05/2010 0410   GLUCOSE 127* 08/05/2010 0410   BUN 19 08/05/2010 0410   CREATININE 1.33 08/05/2010 0410   CALCIUM 8.2* 08/05/2010 0410   PROT 5.5* 07/05/2010 2209   ALBUMIN 3.3* 07/05/2010 2209   AST 20 07/05/2010 2209   ALT 18 07/05/2010 2209   ALKPHOS 101 07/05/2010 2209   BILITOT 0.5 07/05/2010 2209   GFRNONAA 53* 08/05/2010 0410   GFRAA  08/05/2010 0410    >60        The eGFR has been calculated using the MDRD equation. This calculation has not been validated in all clinical situations. eGFR's persistently <60 mL/min signify possible Chronic Kidney Disease.   No results found for: CHOL, HDL, LDLCALC, LDLDIRECT, TRIG, CHOLHDL No results found for: HGBA1C No results found for: VITAMINB12 No results found for: TSH   04/23/15 MRI brain [I reviewed images myself and agree with interpretation. -[facility]]  - Marked BILATERAL ventricular prominence of uncertain significance, but favor global atrophy. Deep white matter hyperintensity is both focal and confluent, and favored to represent chronic microvascular ischemic change. No acute intracranial  findings.  06/03/15 LP (fluoro) --> WBC 3, RBC 790, PROTEIN 98, GLUCOSE 109 1. Normal opening pressure of 10.5 cm water. 2. 10.0 cm of clear CSF was obtained without complication.  05/05/15 ESR 2  05/05/15 CRP 1    ASSESSMENT AND PLAN  74 y.o. year old male here with daily headaches since March 2016, although per cardiology notes, headaches have been there since at least Oct 2015. Now much improved with better sugar control (he cut out ice cream!).   Dx:  Tension headache  OSA (obstructive sleep apnea)   - likely due to hypertension, diabetes, untreated sleep apnea   PLAN: - encouraged patient to resume CPAP therapy for sleep apnea; would likely need new sleep study since last evaluation was more than 4 years ago; patient is not sure he wants to try this - encouraged hydration, proper nutrition, exercise - follow up with PCP re: HTN and diabetes control  Return if symptoms worsen or fail to improve, for return to PCP.    Penni Bombard, MD 97/28/2060, 1:56 PM Certified in Neurology, Neurophysiology and Neuroimaging  [facility] [address], [place], [place] [address] [phone]

## 2015-08-18 NOTE — Patient Instructions (Signed)
Thank you for coming to see [place] at [facility]. I hope we have been able to provide you high quality care today.  You may receive a patient satisfaction survey over the next few weeks. We would appreciate your feedback and comments so that we may continue to improve ourselves and the health of our patients.  - continue current medications - consider sleep apnea testing and treatment   ~~~~~~~~~~~~~~~~~~~~~~~~~~~~~~~~~~~~~~~~~~~~~~~~~~~~~~~~~~~~~~~~~  DR. Rowin Bayron'S GUIDE TO HAPPY AND HEALTHY LIVING These are some of my general health and wellness recommendations. Some of them may apply to you better than others. Please use common sense as you try these suggestions and feel free to ask me any questions.   ACTIVITY/FITNESS Mental, social, emotional and physical stimulation are very important for brain and body health. Try learning a new activity (arts, music, language, sports, games).  Keep moving your body to the best of your abilities. You can do this at home, inside or outside, the park, community center, gym or anywhere you like. Consider a physical therapist or personal trainer to get started. Consider the app Sworkit. Fitness trackers such as smart-watches, smart-phones or Fitbits can help as well.   NUTRITION Eat more plants: colorful vegetables, nuts, seeds and berries.  Eat less sugar, salt, preservatives and processed foods.  Avoid toxins such as cigarettes and alcohol.  Drink water when you are thirsty. Warm water with a slice of lemon is an excellent morning drink to start the day.  Consider these websites for more information The Nutrition Source (https://www.henry-hernandez.biz/) [employer] Nutrition (WindowBlog.ch)   RELAXATION Consider practicing mindfulness meditation or other relaxation techniques such as deep breathing, prayer, yoga, tai chi, massage. See website mindful.org or the apps Headspace or Calm  to help get started.   SLEEP Try to get at least 7-8+ hours sleep per day. Regular exercise and reduced caffeine will help you sleep better. Practice good sleep hygeine techniques. See website sleep.org for more information.   PLANNING Prepare estate planning, living will, healthcare POA documents. Sometimes this is best planned with the help of an attorney. Theconversationproject.org and agingwithdignity.org are excellent resources.

## 2015-09-06 ENCOUNTER — Other Ambulatory Visit: Payer: Self-pay | Admitting: Cardiovascular Disease

## 2015-10-13 ENCOUNTER — Other Ambulatory Visit: Payer: Self-pay | Admitting: Cardiovascular Disease

## 2015-10-13 NOTE — Telephone Encounter (Signed)
Rx request sent to pharmacy.  

## 2015-10-15 ENCOUNTER — Other Ambulatory Visit: Payer: Self-pay | Admitting: Cardiovascular Disease

## 2015-10-15 NOTE — Telephone Encounter (Signed)
Rx request sent to pharmacy.  

## 2015-12-04 ENCOUNTER — Other Ambulatory Visit: Payer: Self-pay | Admitting: *Deleted

## 2015-12-04 MED ORDER — BENAZEPRIL HCL 40 MG PO TABS: 40.0000 mg | ORAL_TABLET | Freq: Every day | ORAL | Status: DC

## 2016-02-02 ENCOUNTER — Other Ambulatory Visit: Payer: Self-pay | Admitting: Cardiovascular Disease

## 2016-02-03 NOTE — Telephone Encounter (Signed)
Rx(s) sent to pharmacy electronically.  

## 2016-03-16 ENCOUNTER — Emergency Department (HOSPITAL_COMMUNITY): Payer: Medicare Other

## 2016-03-16 ENCOUNTER — Observation Stay (HOSPITAL_COMMUNITY)
Admission: EM | Admit: 2016-03-16 | Discharge: 2016-03-19 | Disposition: A | Discharge status: Home / Self Care | Payer: Medicare Other | Attending: Internal Medicine | Admitting: Internal Medicine

## 2016-03-16 ENCOUNTER — Encounter (HOSPITAL_COMMUNITY): Payer: Self-pay

## 2016-03-16 DIAGNOSIS — Z6833 Body mass index (BMI) 33.0-33.9, adult: Secondary | ICD-10-CM | POA: Insufficient documentation

## 2016-03-16 DIAGNOSIS — Z7982 Long term (current) use of aspirin: Secondary | ICD-10-CM | POA: Insufficient documentation

## 2016-03-16 DIAGNOSIS — E785 Hyperlipidemia, unspecified: Secondary | ICD-10-CM | POA: Diagnosis not present

## 2016-03-16 DIAGNOSIS — Z87891 Personal history of nicotine dependence: Secondary | ICD-10-CM | POA: Insufficient documentation

## 2016-03-16 DIAGNOSIS — I5032 Chronic diastolic (congestive) heart failure: Secondary | ICD-10-CM | POA: Insufficient documentation

## 2016-03-16 DIAGNOSIS — G4733 Obstructive sleep apnea (adult) (pediatric): Secondary | ICD-10-CM | POA: Diagnosis not present

## 2016-03-16 DIAGNOSIS — Z7984 Long term (current) use of oral hypoglycemic drugs: Secondary | ICD-10-CM | POA: Diagnosis not present

## 2016-03-16 DIAGNOSIS — E1165 Type 2 diabetes mellitus with hyperglycemia: Secondary | ICD-10-CM | POA: Insufficient documentation

## 2016-03-16 DIAGNOSIS — IMO0002 Reserved for concepts with insufficient information to code with codable children: Secondary | ICD-10-CM

## 2016-03-16 DIAGNOSIS — E876 Hypokalemia: Secondary | ICD-10-CM | POA: Insufficient documentation

## 2016-03-16 DIAGNOSIS — E669 Obesity, unspecified: Secondary | ICD-10-CM | POA: Diagnosis not present

## 2016-03-16 DIAGNOSIS — I10 Essential (primary) hypertension: Secondary | ICD-10-CM | POA: Diagnosis present

## 2016-03-16 DIAGNOSIS — I951 Orthostatic hypotension: Secondary | ICD-10-CM | POA: Diagnosis not present

## 2016-03-16 DIAGNOSIS — N182 Chronic kidney disease, stage 2 (mild): Secondary | ICD-10-CM | POA: Insufficient documentation

## 2016-03-16 DIAGNOSIS — Z79899 Other long term (current) drug therapy: Secondary | ICD-10-CM | POA: Insufficient documentation

## 2016-03-16 DIAGNOSIS — R42 Dizziness and giddiness: Secondary | ICD-10-CM | POA: Diagnosis not present

## 2016-03-16 DIAGNOSIS — N390 Urinary tract infection, site not specified: Secondary | ICD-10-CM | POA: Insufficient documentation

## 2016-03-16 DIAGNOSIS — D320 Benign neoplasm of cerebral meninges: Secondary | ICD-10-CM | POA: Insufficient documentation

## 2016-03-16 DIAGNOSIS — Z7902 Long term (current) use of antithrombotics/antiplatelets: Secondary | ICD-10-CM | POA: Diagnosis not present

## 2016-03-16 DIAGNOSIS — N184 Chronic kidney disease, stage 4 (severe): Secondary | ICD-10-CM | POA: Diagnosis present

## 2016-03-16 DIAGNOSIS — R51 Headache: Secondary | ICD-10-CM | POA: Insufficient documentation

## 2016-03-16 DIAGNOSIS — Z794 Long term (current) use of insulin: Secondary | ICD-10-CM

## 2016-03-16 DIAGNOSIS — E1122 Type 2 diabetes mellitus with diabetic chronic kidney disease: Secondary | ICD-10-CM | POA: Insufficient documentation

## 2016-03-16 DIAGNOSIS — Z8673 Personal history of transient ischemic attack (TIA), and cerebral infarction without residual deficits: Secondary | ICD-10-CM | POA: Insufficient documentation

## 2016-03-16 DIAGNOSIS — I130 Hypertensive heart and chronic kidney disease with heart failure and stage 1 through stage 4 chronic kidney disease, or unspecified chronic kidney disease: Secondary | ICD-10-CM | POA: Diagnosis not present

## 2016-03-16 DIAGNOSIS — E1121 Type 2 diabetes mellitus with diabetic nephropathy: Secondary | ICD-10-CM

## 2016-03-16 LAB — I-STAT CHEM 8, ED
BUN: 15 mg/dL (ref 6–20)
CHLORIDE: 101 mmol/L (ref 101–111)
Calcium, Ion: 1.04 mmol/L — ABNORMAL LOW (ref 1.12–1.23)
Creatinine, Ser: 1.1 mg/dL (ref 0.61–1.24)
Glucose, Bld: 322 mg/dL — ABNORMAL HIGH (ref 65–99)
HEMATOCRIT: 45 % (ref 39.0–52.0)
Hemoglobin: 15.3 g/dL (ref 13.0–17.0)
Potassium: 3.4 mmol/L — ABNORMAL LOW (ref 3.5–5.1)
SODIUM: 137 mmol/L (ref 135–145)
TCO2: 22 mmol/L (ref 0–100)

## 2016-03-16 LAB — CBC
HCT: 44.1 % (ref 39.0–52.0)
Hemoglobin: 15.2 g/dL (ref 13.0–17.0)
MCH: 28.8 pg (ref 26.0–34.0)
MCHC: 34.5 g/dL (ref 30.0–36.0)
MCV: 83.7 fL (ref 78.0–100.0)
PLATELETS: 231 10*3/uL (ref 150–400)
RBC: 5.27 MIL/uL (ref 4.22–5.81)
RDW: 12.8 % (ref 11.5–15.5)
WBC: 6.8 10*3/uL (ref 4.0–10.5)

## 2016-03-16 LAB — COMPREHENSIVE METABOLIC PANEL
ALT: 15 U/L — ABNORMAL LOW (ref 17–63)
ANION GAP: 12 (ref 5–15)
AST: 12 U/L — ABNORMAL LOW (ref 15–41)
Albumin: 3.6 g/dL (ref 3.5–5.0)
Alkaline Phosphatase: 96 U/L (ref 38–126)
BUN: 13 mg/dL (ref 6–20)
CALCIUM: 8.7 mg/dL — AB (ref 8.9–10.3)
CHLORIDE: 103 mmol/L (ref 101–111)
CO2: 20 mmol/L — AB (ref 22–32)
Creatinine, Ser: 1.16 mg/dL (ref 0.61–1.24)
GFR calc non Af Amer: 59 mL/min — ABNORMAL LOW (ref >60)
Glucose, Bld: 313 mg/dL — ABNORMAL HIGH (ref 65–99)
POTASSIUM: 3.4 mmol/L — AB (ref 3.5–5.1)
SODIUM: 135 mmol/L (ref 135–145)
Total Bilirubin: 0.8 mg/dL (ref 0.3–1.2)
Total Protein: 6.1 g/dL — ABNORMAL LOW (ref 6.5–8.1)

## 2016-03-16 LAB — PROTIME-INR
INR: 1.15 (ref 0.00–1.49)
PROTHROMBIN TIME: 14.9 s (ref 11.6–15.2)

## 2016-03-16 LAB — URINALYSIS, ROUTINE W REFLEX MICROSCOPIC
BILIRUBIN URINE: NEGATIVE
Glucose, UA: >1000 mg/dL — AB
Ketones, ur: 15 mg/dL — AB
Leukocytes, UA: NEGATIVE
NITRITE: POSITIVE — AB
PH: 6 (ref 5.0–8.0)
Protein, ur: >300 mg/dL — AB
SPECIFIC GRAVITY, URINE: 1.034 — AB (ref 1.005–1.030)

## 2016-03-16 LAB — DIFFERENTIAL
BASOS PCT: 0 %
Basophils Absolute: 0 10*3/uL (ref 0.0–0.1)
EOS ABS: 0 10*3/uL (ref 0.0–0.7)
EOS PCT: 0 %
Lymphocytes Relative: 14 %
Lymphs Abs: 1 10*3/uL (ref 0.7–4.0)
MONO ABS: 0.3 10*3/uL (ref 0.1–1.0)
Monocytes Relative: 4 %
NEUTROS PCT: 82 %
Neutro Abs: 5.5 10*3/uL (ref 1.7–7.7)

## 2016-03-16 LAB — URINE MICROSCOPIC-ADD ON

## 2016-03-16 LAB — RAPID URINE DRUG SCREEN, HOSP PERFORMED
AMPHETAMINES: NOT DETECTED
BENZODIAZEPINES: NOT DETECTED
Barbiturates: NOT DETECTED
COCAINE: NOT DETECTED
OPIATES: NOT DETECTED
Tetrahydrocannabinol: NOT DETECTED

## 2016-03-16 LAB — ETHANOL: Alcohol, Ethyl (B): <5 mg/dL (ref <5)

## 2016-03-16 LAB — I-STAT TROPONIN, ED: Troponin i, poc: 0.01 ng/mL (ref 0.00–0.08)

## 2016-03-16 LAB — CBG MONITORING, ED: Glucose-Capillary: 269 mg/dL — ABNORMAL HIGH (ref 65–99)

## 2016-03-16 LAB — APTT: aPTT: 32 seconds (ref 24–37)

## 2016-03-16 MED ORDER — SODIUM CHLORIDE 0.9 % IV BOLUS (SEPSIS)
500.0000 mL | Freq: Once | INTRAVENOUS | Status: AC
  Administered 2016-03-16: 500 mL via INTRAVENOUS

## 2016-03-16 MED ORDER — DEXTROSE 5 % IV SOLN
1.0000 g | Freq: Once | INTRAVENOUS | Status: AC
  Administered 2016-03-16: 1 g via INTRAVENOUS
  Filled 2016-03-16: qty 10

## 2016-03-16 MED ORDER — AMLODIPINE BESYLATE 5 MG PO TABS
5.0000 mg | ORAL_TABLET | Freq: Once | ORAL | Status: AC
  Administered 2016-03-16: 5 mg via ORAL
  Filled 2016-03-16: qty 1

## 2016-03-16 MED ORDER — BENAZEPRIL HCL 40 MG PO TABS
40.0000 mg | ORAL_TABLET | Freq: Once | ORAL | Status: AC
  Administered 2016-03-16: 40 mg via ORAL
  Filled 2016-03-16 (×2): qty 1

## 2016-03-16 MED ORDER — MECLIZINE HCL 25 MG PO TABS
25.0000 mg | ORAL_TABLET | Freq: Once | ORAL | Status: AC
Start: 2016-03-16 — End: 2016-03-16
  Administered 2016-03-16: 25 mg via ORAL
  Filled 2016-03-16: qty 1

## 2016-03-16 MED ORDER — ONDANSETRON HCL 4 MG/2ML IJ SOLN
4.0000 mg | Freq: Once | INTRAMUSCULAR | Status: AC
  Administered 2016-03-16: 4 mg via INTRAVENOUS
  Filled 2016-03-16: qty 2

## 2016-03-16 NOTE — ED Provider Notes (Signed)
CSN: [id number]     Arrival date & time 03/16/16  1547 History   First MD Initiated Contact with Patient 03/16/16 1607     Chief Complaint  Patient presents with  . Dizziness  . Emesis     (Consider location/radiation/quality/duration/timing/severity/associated sxs/prior Treatment) HPI  Pt presenting with c/o spinning sensation.  Pt states he felt fine when he went to sleep last night, then in the middle of the night got up to use the bathroom- felt sensation of spinning and felt he would fall backwards.  He also became nauseated and had several episodes of vomiting.  No focal weakness.  No changes in vision or speech.  No fall.  Has not had similar symptoms in the past.  No chest pain or difficulty breathing.  He has not tried anything for his symptoms.  Symptoms have been continous- worse with movement and trying to stand up.  There are no other associated systemic symptoms, there are no other alleviating or modifying factors.   Past Medical History  Diagnosis Date  . Chronic diastolic CHF (congestive heart failure), NYHA class 1 ([place])     currently euvolemic  . Systemic hypertension   . Obstructive sleep apnea     he refused to use cpap  . Obesity     moderate to severe  . DM (diabetes mellitus) ([place])     type 2  . Chronic kidney disease (CKD), stage II (mild)   . Chest pain     most likely Gastroesophageal reflux  . TIA (transient ischemic attack)     in Feb of 2004  . Cerebrovascular disease     by MRA in Feb 2004 with moderate posterior cerebral artery stenosis  . Dyspnea   . Dyslipidemia 2009  . Hyperlipidemia    Past Surgical History  Procedure Laterality Date  . Doppler echocardiography      renal artery and they were within normal limits  . Cardiovascular stress test      within normal limits EF of 54%   Family History  Problem Relation Age of Onset  . Stroke Mother   . Heart failure Father   . Diabetes type II Father   . Cancer Sister   . Arthritis Maternal  Grandmother   . Heart failure Maternal Grandfather   . Diabetes type I Paternal Grandmother   . Dementia Paternal Grandfather   . Heart failure Paternal Grandfather    Social History  Substance Use Topics  . Smoking status: Former Research scientist (life sciences)  . Smokeless tobacco: Never Used  . Alcohol Use: No    Review of Systems  ROS reviewed and all otherwise negative except for mentioned in HPI    Allergies  Review of patient's allergies indicates no known allergies.  Home Medications   Prior to Admission medications   Medication Sig Start Date End Date Taking? Authorizing Provider  amLODipine (NORVASC) 5 MG tablet TAKE 1 TABLET BY MOUTH DAILY 07/29/15  Yes Mihai Croitoru, MD  aspirin 81 MG tablet Take 81 mg by mouth daily.   Yes Historical Provider, MD  atorvastatin (LIPITOR) 20 MG tablet TAKE 1 TABLET BY MOUTH ONCE DAILY 07/29/15  Yes Mihai Croitoru, MD  benazepril (LOTENSIN) 40 MG tablet Take 1 tablet (40 mg total) by mouth daily. for high blood pressure 12/04/15  Yes Troy Sine, MD  Bromfenac Sodium (PROLENSA) 0.07 % SOLN Place 1 drop into the left eye daily.   Yes Historical Provider, MD  clopidogrel (PLAVIX) 75 MG tablet TAKE 1  TABLET BY MOUTH DAILY 07/29/15  Yes Mihai Croitoru, MD  fesoterodine (TOVIAZ) 8 MG TB24 tablet Take 8 mg by mouth daily.   Yes Historical Provider, MD  furosemide (LASIX) 40 MG tablet TAKE 1 TABLET BY MOUTH DAILY. MAY INCREASE TO 2 TABLETS DAILY IF WEIGHT INCREASES TO MORE THAN 2 POUNDS FROM BASELINE 07/29/15  Yes Mihai Croitoru, MD  glimepiride (AMARYL) 4 MG tablet Take 4 mg by mouth daily.    Yes Historical Provider, MD  metFORMIN (GLUCOPHAGE) 1000 MG tablet Take 1,000 mg by mouth daily.    Yes Historical Provider, MD  omeprazole (PRILOSEC) 40 MG capsule TAKE 1 CAPSULE BY MOUTH ONCE DAILY 10/13/15  Yes Mihai Croitoru, MD  pantoprazole (PROTONIX) 40 MG tablet TAKE 1 TABLET BY MOUTH DAILY 10/15/15  Yes Mihai Croitoru, MD  Potassium Chloride Crys CR (KLOR-CON M20 PO)  Take 20 mEq by mouth daily.    Yes Historical Provider, MD  solifenacin (VESICARE) 10 MG tablet Take 10 mg by mouth daily.   Yes Historical Provider, MD  tamsulosin (FLOMAX) 0.4 MG CAPS capsule TAKE 1 CAPSULE BY MOUTH EVERY NIGHT AT BEDTIME 02/03/16  Yes Mihai Croitoru, MD   BP 179/93 mmHg  Pulse 65  Temp(Src) 98.5 F (36.9 C) (Oral)  Resp 24  SpO2 96%  Vitals reviewed Physical Exam  Physical Examination: General appearance - alert, ill appearing, and in no distress Mental status - alert, oriented to person, place, and time Eyes - pupils equal and reactive, extraocular eye movements intact Mouth - mucous membranes moist, pharynx normal without lesions Chest - clear to auscultation, no wheezes, rales or rhonchi, symmetric air entry Heart - normal rate, regular rhythm, normal S1, S2, no murmurs, rubs, clicks or gallops Abdomen - soft, nontender, nondistended, no masses or organomegaly Neurological - alert, oriented, normal speech, cranial nerves 2-12 tested and intact, strength 5/5 in extremities x 4, sensation intact, unable to ambulate due to unsteady gait Extremities - peripheral pulses normal, no pedal edema, no clubbing or cyanosis Skin - normal coloration and turgor, no rashes  ED Course  Procedures (including critical care time) Labs Review Labs Reviewed  COMPREHENSIVE METABOLIC PANEL - Abnormal; Notable for the following:    Potassium 3.4 (*)    CO2 20 (*)    Glucose, Bld 313 (*)    Calcium 8.7 (*)    Total Protein 6.1 (*)    AST 12 (*)    ALT 15 (*)    GFR calc non Af Amer 59 (*)    All other components within normal limits  URINALYSIS, ROUTINE W REFLEX MICROSCOPIC (NOT AT [facility]) - Abnormal; Notable for the following:    APPearance CLOUDY (*)    Specific Gravity, Urine 1.034 (*)    Glucose, UA >1000 (*)    Hgb urine dipstick SMALL (*)    Ketones, ur 15 (*)    Protein, ur >300 (*)    Nitrite POSITIVE (*)    All other components within normal limits  URINE  MICROSCOPIC-ADD ON - Abnormal; Notable for the following:    Squamous Epithelial / LPF 0-5 (*)    Bacteria, UA MANY (*)    All other components within normal limits  I-STAT CHEM 8, ED - Abnormal; Notable for the following:    Potassium 3.4 (*)    Glucose, Bld 322 (*)    Calcium, Ion 1.04 (*)    All other components within normal limits  CBG MONITORING, ED - Abnormal; Notable for the following:  Glucose-Capillary 269 (*)    All other components within normal limits  URINE CULTURE  ETHANOL  PROTIME-INR  APTT  CBC  DIFFERENTIAL  URINE RAPID DRUG SCREEN, HOSP PERFORMED  I-STAT TROPOININ, ED    Imaging Review Ct Head Wo Contrast  03/16/2016  CLINICAL DATA:  Dizziness with gait disturbance EXAM: CT HEAD WITHOUT CONTRAST TECHNIQUE: Contiguous axial images were obtained from the base of the skull through the vertex without intravenous contrast. COMPARISON:  July 07, 2010 head CT and April 23, 2015 brain MRI FINDINGS: Brain: There is extensive generalized atrophy, unchanged. There is no demonstrable intracranial mass, acute hemorrhage, extra-axial fluid collection, or midline shift. There is slight periventricular small vessel disease in the centra semiovale bilaterally with small lacunar infarcts in each inferior centrum semiovale, stable. There is no acute infarct evident. Vascular: There are scattered foci of calcification in both distal vertebral arteries bilaterally. Skull: The bony calvarium appears intact. Sinuses/Orbits: Folds appear symmetric bilaterally. There are retention cysts in each maxillary antrum. Other paranasal sinuses are clear. Other: The orbits appear symmetric bilaterally. There is debris in the left external auditory canal. IMPRESSION: Stable extensive atrophy. Small lacunar infarcts in each centrum semiovale with slight periventricular small vessel disease, stable. No acute infarct evident. No hemorrhage, mass, or extra-axial fluid collection. There is calcification in  each distal vertebral artery. There are retention cysts in each maxillary antrum. There is probable cerumen in the left external auditory canal. Electronically Signed   By: Lowella Grip III M.D.   On: 03/16/2016 17:31   Mr Brain Wo Contrast  03/16/2016  CLINICAL DATA:  Vertigo EXAM: MRI HEAD WITHOUT CONTRAST TECHNIQUE: Multiplanar, multiecho pulse sequences of the brain and surrounding structures were obtained without intravenous contrast. COMPARISON:  CT head 03/16/2016. MRI 04/23/2015. CT head 07/07/2010 FINDINGS: Generalized atrophy. Diffuse ventricular prominence is unchanged from 04/23/2015. 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 Hyperintensity in the pons due to chronic ischemia. Chronic infarct left cerebellum unchanged. Negative for acute infarct. 6 x 8 mm extra-axial mass along the posterior falx on the left just above the tentorium is unchanged from the prior MRI. This shows high signal on diffusion and intermediate signal on T2. On axial T1 imaging this appears extra-axial and is most consistent with meningioma. This is noted to be hyperdense on CT and is probably calcified. This is unchanged from CT of 07/07/2010. Negative for hemorrhage or fluid collection. No shift of the midline structures. Retention cysts in the maxillary sinus bilaterally. Normal orbit. Pituitary not enlarged. IMPRESSION: Negative for acute infarction Diffuse ventricular enlargement is unchanged most consistent with atrophy Subcentimeter meningioma posterior falx is unchanged from prior studies. This appears calcified on CT. Electronically Signed   By: Franchot Gallo M.D.   On: 03/16/2016 19:34   I have personally reviewed and evaluated these images and lab results as part of my medical decision-making.   EKG Interpretation   Date/Time:  Tuesday March 16 2016 16:27:28 EDT Ventricular Rate:   69 PR Interval:    QRS Duration: 93 QT Interval:  435 QTC Calculation: 466 R Axis:   57 Text Interpretation:  Sinus rhythm Prolonged PR interval Nonspecific T  abnormalities, lateral leads No significant change since last tracing  Confirmed by [facility]  MD, Adyline Huberty [id number]) on 03/16/2016 4:49:23 PM      MDM   Final diagnoses:  Vertigo  UTI (lower urinary tract infection)  Pt presenting with vertigo which began in the night last night- unknown last time seen normal.  Stroke workup negative.  Pt found to have UTI, hyperglycemia., hypertension.  He was treated with fluids, nausea meds, meclizine.  He is now able to tolerate po but is still unable to walk due to vertigo symptoms.  D/w [facility] for admission.  Pt given his home BP meds as well now that he is no longer vomiting.    9:13 PM pt is not able to ambulate due to significant vertigo.  He is able to tolerate po fluids after zofran- have ordered his BP meds.  Started on rocephin for UTI- will likely need admission since unable to ambulate.    10:23 PM d/w Dr. Jonnie Finner, [facility], he will see patient in the ED.    Alfonzo Beers, MD 03/16/16 [phone]

## 2016-03-16 NOTE — ED Notes (Signed)
Informed Courtney-RN, about pt's blood pressure.

## 2016-03-16 NOTE — ED Notes (Signed)
Pt states he is dizzy when he stands and he immediately has to sit back down, informed Courtney-RN.

## 2016-03-16 NOTE — ED Notes (Signed)
Er md notified of bp being elevated, no further orders will continue to monitor

## 2016-03-16 NOTE — ED Notes (Signed)
Patient reports feeling fine when he went to sleep last night, and waking up this morning at 0400 with dizziness and vomiting anytime he tries to eat anything, complains of a mild headache in the front of his head

## 2016-03-16 NOTE — ED Notes (Signed)
Admitting MD at bedside.

## 2016-03-16 NOTE — ED Notes (Signed)
Pt's CBG results: 269, informed Courtney-RN.

## 2016-03-16 NOTE — ED Notes (Signed)
Gave pt iced water, per Courtney-RN.

## 2016-03-17 DIAGNOSIS — R42 Dizziness and giddiness: Secondary | ICD-10-CM | POA: Diagnosis not present

## 2016-03-17 DIAGNOSIS — I10 Essential (primary) hypertension: Secondary | ICD-10-CM | POA: Diagnosis not present

## 2016-03-17 DIAGNOSIS — E119 Type 2 diabetes mellitus without complications: Secondary | ICD-10-CM

## 2016-03-17 DIAGNOSIS — N182 Chronic kidney disease, stage 2 (mild): Secondary | ICD-10-CM | POA: Diagnosis not present

## 2016-03-17 LAB — GLUCOSE, CAPILLARY
GLUCOSE-CAPILLARY: 271 mg/dL — AB (ref 65–99)
GLUCOSE-CAPILLARY: 133 mg/dL — AB (ref 65–99)
GLUCOSE-CAPILLARY: 225 mg/dL — AB (ref 65–99)
Glucose-Capillary: 220 mg/dL — ABNORMAL HIGH (ref 65–99)
Glucose-Capillary: 223 mg/dL — ABNORMAL HIGH (ref 65–99)

## 2016-03-17 LAB — TSH: TSH: 3.292 u[IU]/mL (ref 0.350–4.500)

## 2016-03-17 LAB — MAGNESIUM: MAGNESIUM: 1.8 mg/dL (ref 1.7–2.4)

## 2016-03-17 MED ORDER — SODIUM CHLORIDE 0.9 % IV SOLN: 250.0000 mL | INTRAVENOUS | Status: DC | PRN

## 2016-03-17 MED ORDER — SODIUM CHLORIDE 0.9% FLUSH: 3.0000 mL | INTRAVENOUS | Status: DC | PRN

## 2016-03-17 MED ORDER — MECLIZINE HCL 12.5 MG PO TABS
25.0000 mg | ORAL_TABLET | Freq: Three times a day (TID) | ORAL | Status: AC
  Administered 2016-03-17 – 2016-03-18 (×6): 25 mg via ORAL
  Filled 2016-03-17 (×7): qty 2

## 2016-03-17 MED ORDER — ATORVASTATIN CALCIUM 10 MG PO TABS
20.0000 mg | ORAL_TABLET | Freq: Every day | ORAL | Status: DC
  Administered 2016-03-17 – 2016-03-18 (×2): 20 mg via ORAL
  Filled 2016-03-17 (×2): qty 2

## 2016-03-17 MED ORDER — SODIUM CHLORIDE 0.9% FLUSH
3.0000 mL | Freq: Two times a day (BID) | INTRAVENOUS | Status: DC
  Administered 2016-03-17 – 2016-03-19 (×5): 3 mL via INTRAVENOUS

## 2016-03-17 MED ORDER — INSULIN ASPART 100 UNIT/ML ~~LOC~~ SOLN
0.0000 [IU] | Freq: Three times a day (TID) | SUBCUTANEOUS | Status: DC
  Administered 2016-03-17: 5 [IU] via SUBCUTANEOUS
  Administered 2016-03-17: 2 [IU] via SUBCUTANEOUS
  Administered 2016-03-17: 5 [IU] via SUBCUTANEOUS
  Administered 2016-03-18: 3 [IU] via SUBCUTANEOUS
  Administered 2016-03-18: 2 [IU] via SUBCUTANEOUS
  Administered 2016-03-19: 5 [IU] via SUBCUTANEOUS

## 2016-03-17 MED ORDER — ONDANSETRON HCL 4 MG/2ML IJ SOLN: 4.0000 mg | Freq: Four times a day (QID) | INTRAMUSCULAR | Status: DC | PRN

## 2016-03-17 MED ORDER — FESOTERODINE FUMARATE ER 8 MG PO TB24
8.0000 mg | ORAL_TABLET | Freq: Every day | ORAL | Status: DC
  Administered 2016-03-17 – 2016-03-19 (×3): 8 mg via ORAL
  Filled 2016-03-17 (×3): qty 1

## 2016-03-17 MED ORDER — HYDROCODONE-ACETAMINOPHEN 5-325 MG PO TABS
1.0000 | ORAL_TABLET | ORAL | Status: DC | PRN
  Administered 2016-03-18: 2 via ORAL
  Filled 2016-03-17: qty 2

## 2016-03-17 MED ORDER — ENOXAPARIN SODIUM 60 MG/0.6ML ~~LOC~~ SOLN
55.0000 mg | SUBCUTANEOUS | Status: DC
  Administered 2016-03-17 – 2016-03-19 (×3): 55 mg via SUBCUTANEOUS
  Filled 2016-03-17 (×3): qty 0.55

## 2016-03-17 MED ORDER — CLOPIDOGREL BISULFATE 75 MG PO TABS
75.0000 mg | ORAL_TABLET | Freq: Every day | ORAL | Status: DC
  Administered 2016-03-17 – 2016-03-19 (×3): 75 mg via ORAL
  Filled 2016-03-17 (×3): qty 1

## 2016-03-17 MED ORDER — METFORMIN HCL 500 MG PO TABS
1000.0000 mg | ORAL_TABLET | Freq: Every day | ORAL | Status: DC
  Administered 2016-03-17: 1000 mg via ORAL
  Filled 2016-03-17: qty 2

## 2016-03-17 MED ORDER — ONDANSETRON HCL 4 MG PO TABS: 4.0000 mg | ORAL_TABLET | Freq: Four times a day (QID) | ORAL | Status: DC | PRN

## 2016-03-17 MED ORDER — AMLODIPINE BESYLATE 5 MG PO TABS
5.0000 mg | ORAL_TABLET | Freq: Every day | ORAL | Status: DC
Start: 2016-03-18 — End: 2016-03-19
  Administered 2016-03-18 – 2016-03-19 (×2): 5 mg via ORAL
  Filled 2016-03-17 (×2): qty 1

## 2016-03-17 MED ORDER — POTASSIUM CHLORIDE CRYS ER 20 MEQ PO TBCR
20.0000 meq | EXTENDED_RELEASE_TABLET | Freq: Every day | ORAL | Status: DC
  Administered 2016-03-17: 20 meq via ORAL
  Filled 2016-03-17: qty 1

## 2016-03-17 MED ORDER — GLIMEPIRIDE 4 MG PO TABS
4.0000 mg | ORAL_TABLET | Freq: Every day | ORAL | Status: DC
Start: 2016-03-17 — End: 2016-03-17
  Administered 2016-03-17: 4 mg via ORAL
  Filled 2016-03-17: qty 1

## 2016-03-17 MED ORDER — ACETAMINOPHEN 650 MG RE SUPP: 650.0000 mg | Freq: Four times a day (QID) | RECTAL | Status: DC | PRN

## 2016-03-17 MED ORDER — BENAZEPRIL HCL 40 MG PO TABS
40.0000 mg | ORAL_TABLET | Freq: Every day | ORAL | Status: DC
  Administered 2016-03-18 – 2016-03-19 (×2): 40 mg via ORAL
  Filled 2016-03-17 (×2): qty 1
  Filled 2016-03-17 (×2): qty 2

## 2016-03-17 MED ORDER — KETOROLAC TROMETHAMINE 0.5 % OP SOLN
1.0000 [drp] | Freq: Every day | OPHTHALMIC | Status: DC
  Administered 2016-03-17 – 2016-03-19 (×3): 1 [drp] via OPHTHALMIC
  Filled 2016-03-17: qty 3

## 2016-03-17 MED ORDER — ASPIRIN 81 MG PO CHEW
81.0000 mg | CHEWABLE_TABLET | Freq: Every day | ORAL | Status: DC
  Administered 2016-03-17 – 2016-03-19 (×3): 81 mg via ORAL
  Filled 2016-03-17 (×3): qty 1

## 2016-03-17 MED ORDER — PANTOPRAZOLE SODIUM 40 MG PO TBEC
40.0000 mg | DELAYED_RELEASE_TABLET | Freq: Every day | ORAL | Status: DC
  Administered 2016-03-17 – 2016-03-19 (×3): 40 mg via ORAL
  Filled 2016-03-17 (×4): qty 1

## 2016-03-17 MED ORDER — TAMSULOSIN HCL 0.4 MG PO CAPS
0.4000 mg | ORAL_CAPSULE | Freq: Every day | ORAL | Status: DC
  Administered 2016-03-17 – 2016-03-18 (×3): 0.4 mg via ORAL
  Filled 2016-03-17 (×3): qty 1

## 2016-03-17 MED ORDER — LABETALOL HCL 5 MG/ML IV SOLN
10.0000 mg | Freq: Four times a day (QID) | INTRAVENOUS | Status: DC | PRN
  Administered 2016-03-17 – 2016-03-19 (×3): 10 mg via INTRAVENOUS
  Filled 2016-03-17 (×3): qty 4

## 2016-03-17 MED ORDER — ACETAMINOPHEN 325 MG PO TABS
650.0000 mg | ORAL_TABLET | Freq: Four times a day (QID) | ORAL | Status: DC | PRN
  Administered 2016-03-17: 650 mg via ORAL
  Filled 2016-03-17: qty 2

## 2016-03-17 MED ORDER — INSULIN ASPART 100 UNIT/ML ~~LOC~~ SOLN
0.0000 [IU] | Freq: Every day | SUBCUTANEOUS | Status: DC
  Administered 2016-03-17: 2 [IU] via SUBCUTANEOUS
  Administered 2016-03-17: 3 [IU] via SUBCUTANEOUS

## 2016-03-17 MED ORDER — DARIFENACIN HYDROBROMIDE ER 7.5 MG PO TB24
7.5000 mg | ORAL_TABLET | Freq: Every day | ORAL | Status: DC
  Administered 2016-03-17 – 2016-03-19 (×3): 7.5 mg via ORAL
  Filled 2016-03-17 (×3): qty 1

## 2016-03-17 MED ORDER — SODIUM CHLORIDE 0.9 % IV SOLN
INTRAVENOUS | Status: DC
  Administered 2016-03-17: 01:00:00 via INTRAVENOUS

## 2016-03-17 NOTE — Evaluation (Signed)
Physical Therapy Evaluation Patient Details Name: Micheal Holt MRN: [id number] DOB: Dec 11, 1940 Today's Date: 03/17/2016   History of Present Illness  pt is a 75 y/o male with h/o dm2, HTN, admitted with vertigo, N/V.  MRI/CT show no acute stroke.  Symptom suggest BPPV.  Clinical Impression  Pt admitted with/for vertigo.  Pt currently limited functionally due to the problems listed below.  (see problems list.)  Pt will benefit from PT to maximize function and safety to be able to get home safely with available assist of family.     Follow Up Recommendations Other (comment) (depending on results, may need OPPT)    Equipment Recommendations  None recommended by PT    Recommendations for Other Services       Precautions / Restrictions Precautions Precautions: Fall      Mobility  Bed Mobility Overal bed mobility: Needs Assistance Bed Mobility: Supine to Sit;Sit to Supine     Supine to sit: Supervision Sit to supine: Supervision   General bed mobility comments: min assist during /after Vestibular testing  Transfers Overall transfer level: Needs assistance   Transfers: Sit to/from Stand Sit to Stand: Supervision            Ambulation/Gait Ambulation/Gait assistance: Min guard Ambulation Distance (Feet): 150 Feet Assistive device: None Gait Pattern/deviations: Step-through pattern     General Gait Details: generally steady, but guarded after all the vestibular testing  Stairs            Wheelchair Mobility    Modified Rankin (Stroke Patients Only)       Balance Overall balance assessment: No apparent balance deficits (not formally assessed)                                           Pertinent Vitals/Pain Pain Assessment: No/denies pain    Home Living Family/patient expects to be discharged to:: Private residence Living Arrangements: Spouse/significant other Available Help at Discharge: Family                  Prior  Function Level of Independence: Independent               Hand Dominance        Extremity/Trunk Assessment   Upper Extremity Assessment: Overall WFL for tasks assessed           Lower Extremity Assessment: Overall WFL for tasks assessed      Cervical / Trunk Assessment: Normal  Communication   Communication: No difficulties  Cognition Arousal/Alertness: Awake/alert;Lethargic Behavior During Therapy: WFL for tasks assessed/performed Overall Cognitive Status: Within Functional Limits for tasks assessed                      General Comments General comments (skin integrity, edema, etc.): Vestibular assessment.  Answers to subective questions suggestive of BPPV.  Conducted supine Head roll at 30* with no sign of horizontal canal involvement.  Nextchecked for posterior canal involvment.  Conducted Hallpike-Dix for right side without any negative outcome, but Hallpike -Dix for L side showed significant torsional nystagmus and spinning.  Treated pt with two trials of the Epply maneuver, with classic results.  Will return for check 7/13.    Exercises        Assessment/Plan    PT Assessment Patient needs continued PT services  PT Diagnosis Other (comment) (BPPV)   PT Problem List  Other (comment) (vertigo, BPPV)  PT Treatment Interventions  (Vestibular treatment for BPPV)   PT Goals (Current goals can be found in the Care Plan section) Acute Rehab PT Goals Patient Stated Goal: get rid of this vertigo. PT Goal Formulation: With patient Time For Goal Achievement: 03/24/16 Potential to Achieve Goals: Good    Frequency Min 3X/week   Barriers to discharge        Co-evaluation               End of Session   Activity Tolerance: Patient tolerated treatment well Patient left: in bed;with call bell/phone within reach;with bed alarm set Nurse Communication: Mobility status    Functional Assessment Tool Used: clinical judgement Functional Limitation:  Mobility: Walking and moving around Mobility: Walking and Moving Around Current Status [id number]): At least 1 percent but less than 20 percent impaired, limited or restricted Mobility: Walking and Moving Around Goal Status [phone]): 0 percent impaired, limited or restricted    Time: 1005-1107 PT Time Calculation (min) (ACUTE ONLY): 62 min   Charges:   PT Evaluation $PT Eval Moderate Complexity: 1 Procedure PT Treatments $Neuromuscular Re-education: 23-37 mins $Canalith Rep Proc: 8-22 mins   PT G Codes:   PT G-Codes **NOT FOR INPATIENT CLASS** Functional Assessment Tool Used: clinical judgement Functional Limitation: Mobility: Walking and moving around Mobility: Walking and Moving Around Current Status [id number]): At least 1 percent but less than 20 percent impaired, limited or restricted Mobility: Walking and Moving Around Goal Status [phone]): 0 percent impaired, limited or restricted    Aizlyn Schifano, Tessie Fass 03/17/2016, 3:03 PM 03/17/2016  Donnella Sham, PT [phone]  (pager)

## 2016-03-17 NOTE — Care Management Obs Status (Signed)
[facility] NOTIFICATION   Patient Details  Name: Micheal Holt MRN: [id number] Date of Birth: [birth year]   Medicare Observation Status Notification Given:  Yes (MRI negative)    Pollie Friar, RN 03/17/2016, 3:03 PM

## 2016-03-17 NOTE — H&P (Signed)
[facility] History and Physical  Micheal Holt [id number] DOB: 1940/09/17 DOA: 03/16/2016  Referring physician: Dr Canary Brim PCP: Leonard Downing, MD   Chief Complaint: Vertigo  HPI: Micheal Holt is a 75 y.o. male with history of DM2, HTN, OSA, TIA on plavix, HL who presented with vertigo and n/v to ED this afternoon.  In ED MRI and CT did not show any acute findings.  Rec'd antivert but still couldn't walk. Asked to see for admission.   Pt awoke early this am and says the "room was just spinning". He tried to get up but couldn't walk.  He vomited what he tried to drink.  This continued through the day then at 4 pm he decided to come to ED.  Denies any associated blurred vision, though he just had cataract surgery on both eyes (individually) in the last few weeks.  +HA.  No CP, sob , no abd pain , n/v/d.  No voiding c/o.  No hx of vertigo in the past, inner ear problems, ear infection or hearing loss.    Had TIA w difficulty interpreting computer screen writing about 10 yrs ago, has taken plavix since then for this. No hx of heart disease, afib or CHF per pt.  +HTN.   Patient grew up in [place], had 2 sisters who are deceased. Went to [employer] and then joined the [employer].  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  Has one son who lives in [place], married and lives w his wife in [place].  Retired.  No tob/ etoh.       ROS  denies CP  no joint pain   no blurry vision  no rash  no diarrhea  no dysuria  no difficulty voiding  no change in urine color    Past Medical History  Past Medical History  Diagnosis Date  . Chronic diastolic CHF (congestive heart failure), NYHA class 1 ([place])     currently euvolemic  . Systemic hypertension   . Obstructive sleep apnea     he refused to use cpap  . Obesity     moderate to severe  . DM (diabetes mellitus) ([place])     type 2  . Chronic kidney disease (CKD), stage II (mild)   . Chest pain      most likely Gastroesophageal reflux  . TIA (transient ischemic attack)     in Feb of 2004  . Cerebrovascular disease     by MRA in Feb 2004 with moderate posterior cerebral artery stenosis  . Dyspnea   . Dyslipidemia 2009  . Hyperlipidemia    Past Surgical History  Past Surgical History  Procedure Laterality Date  . Doppler echocardiography      renal artery and they were within normal limits  . Cardiovascular stress test      within normal limits EF of 54%   Family History  Family History  Problem Relation Age of Onset  . Stroke Mother   . Heart failure Father   . Diabetes type II Father   . Cancer Sister   . Arthritis Maternal Grandmother   . Heart failure Maternal Grandfather   . Diabetes type I Paternal Grandmother   . Dementia Paternal Grandfather   . Heart failure Paternal Grandfather    Social History  reports that he has quit smoking. He has never used smokeless tobacco. He reports that he does not drink alcohol or use illicit drugs. Allergies No Known  Allergies Home medications Prior to Admission medications   Medication Sig Start Date End Date Taking? Authorizing Provider  amLODipine (NORVASC) 5 MG tablet TAKE 1 TABLET BY MOUTH DAILY 07/29/15  Yes Mihai Croitoru, MD  aspirin 81 MG tablet Take 81 mg by mouth daily.   Yes Historical Provider, MD  atorvastatin (LIPITOR) 20 MG tablet TAKE 1 TABLET BY MOUTH ONCE DAILY 07/29/15  Yes Mihai Croitoru, MD  benazepril (LOTENSIN) 40 MG tablet Take 1 tablet (40 mg total) by mouth daily. for high blood pressure 12/04/15  Yes Troy Sine, MD  Bromfenac Sodium (PROLENSA) 0.07 % SOLN Place 1 drop into the left eye daily.   Yes Historical Provider, MD  clopidogrel (PLAVIX) 75 MG tablet TAKE 1 TABLET BY MOUTH DAILY 07/29/15  Yes Mihai Croitoru, MD  fesoterodine (TOVIAZ) 8 MG TB24 tablet Take 8 mg by mouth daily.   Yes Historical Provider, MD  furosemide (LASIX) 40 MG tablet TAKE 1 TABLET BY MOUTH DAILY. MAY INCREASE TO 2  TABLETS DAILY IF WEIGHT INCREASES TO MORE THAN 2 POUNDS FROM BASELINE 07/29/15  Yes Mihai Croitoru, MD  glimepiride (AMARYL) 4 MG tablet Take 4 mg by mouth daily.    Yes Historical Provider, MD  metFORMIN (GLUCOPHAGE) 1000 MG tablet Take 1,000 mg by mouth daily.    Yes Historical Provider, MD  omeprazole (PRILOSEC) 40 MG capsule TAKE 1 CAPSULE BY MOUTH ONCE DAILY 10/13/15  Yes Mihai Croitoru, MD  pantoprazole (PROTONIX) 40 MG tablet TAKE 1 TABLET BY MOUTH DAILY 10/15/15  Yes Mihai Croitoru, MD  Potassium Chloride Crys CR (KLOR-CON M20 PO) Take 20 mEq by mouth daily.    Yes Historical Provider, MD  solifenacin (VESICARE) 10 MG tablet Take 10 mg by mouth daily.   Yes Historical Provider, MD  tamsulosin (FLOMAX) 0.4 MG CAPS capsule TAKE 1 CAPSULE BY MOUTH EVERY NIGHT AT BEDTIME 02/03/16  Yes Mihai Croitoru, MD   Liver Function Tests  Recent Labs Lab 03/16/16 1642  AST 12*  ALT 15*  ALKPHOS 96  BILITOT 0.8  PROT 6.1*  ALBUMIN 3.6   No results for input(s): LIPASE, AMYLASE in the last 168 hours. CBC  Recent Labs Lab 03/16/16 1642 03/16/16 1651  WBC 6.8  --   NEUTROABS 5.5  --   HGB 15.2 15.3  HCT 44.1 45.0  MCV 83.7  --   PLT 231  --    Basic Metabolic Panel  Recent Labs Lab 03/16/16 1642 03/16/16 1651  NA 135 137  K 3.4* 3.4*  CL 103 101  CO2 20*  --   GLUCOSE 313* 322*  BUN 13 15  CREATININE 1.16 1.10  CALCIUM 8.7*  --      Filed Vitals:   03/16/16 2215 03/16/16 2245 03/16/16 2300 03/16/16 2330  BP: 179/93 197/90 193/97 182/99  Pulse: 65 63 62 62  Temp:      TempSrc:      Resp: 24 20 20 23   SpO2: 96% 98% 97% 97%   Exam: Gen alert, no distress No rash, cyanosis or gangrene TM's ok Sclera anicteric, throat clear  No jvd or bruits Chest clear bilat RRR no MRG Abd soft ntnd no mass or ascites +bs obese GU normal male MS no joint effusions or deformity Ext no LE or UE edema / no wounds or ulcers Neuro is alert, Ox 3 , nf    EKG (independ reviewed) > NSR  no acute MRI head > Negative for acute infarction Diffuse ventricular enlargement is unchanged most  consistent with atrophy Subcentimeter meningioma posterior falx is unchanged from prior studies. This appears calcified on CT CT head > Stable extensive atrophy. Small lacunar infarcts in each centrum semiovale with slight periventricular small vessel disease, stable. No acute infarct evident. No hemorrhage, mass, or extra-axial fluid collection. There is calcification in each distal vertebral artery. There are retention cysts in each maxillary antrum. There is probable cerumen in the left external auditory canal.    Na 137  K 3.4   BUN 15 glu 322   Alb 3.6   Hb 15  WBC 6k UA cloudy, +6-30 wbc, >300 prot, >1000 glu UDS neg   Assessment: 1.  Acute vertigo- unable to ambulate 2.  Uncontrolled HTN - cont home meds except hold lasix 3.  Uncontrolled DM2 on oral agents at home 4.  Mild CKD, stable 5.  Hist remote TIA on plavix 6.  Headache   Plan - admit , po and IV bp meds, po DM meds +SSI, antivert. OBS status. NS at 50/hr. Nausea and pain meds prn.      Sol Blazing [facility] Pager [phone]  Cell [phone]  If 7PM-7AM, please contact night-coverage [email or web address] Password [email or web address] 03/17/2016, 12:00 AM

## 2016-03-17 NOTE — Progress Notes (Addendum)
PROGRESS NOTE  Micheal Holt  [id number] DOB: 03-07-1941  DOA: 03/16/2016 PCP: Leonard Downing, MD   Brief Narrative:  75 y.o. male with history of DM2, HTN, OSA, TIA on plavix, HL who presented with vertigo and n/v to ED. In ED MRI and CT did not show any acute findings.Rec'd antivert but still couldn't walk. Symptoms began on 7/11 at 4 AM. Denies strokelike symptoms or URI symptoms. No prior history of vertigo. Admitted for further evaluation and management.   Assessment & Plan:   Principal Problem:   Vertigo Active Problems:   Uncontrolled hypertension   CKD (chronic kidney disease) stage 2, GFR 60-89 ml/min   Diabetes type 2, uncontrolled (HCC)   Acute Vertigo - CT head and MRI brain without acute stroke. - Orthostatic changes positive (BP 188/78 supine >157/73 standing). However do not believe that this is contributing to his vertigo. Patient has symptoms even when lying in bed. - ? BPPV. PT consulted for vestibular function evaluation and treatment. Continue meclizine.  Essential hypertension, uncontrolled - Continue amlodipine, benazepril. Continue when necessary IV labetalol. Lasix held temporarily.  Uncontrolled DM 2 - Hold oral hypoglycemics while hospitalized (received this morning's dose of Amaryl and metformin). Continue SSI. May need to add low-dose Lantus. Check A1c & TSH.  Hypokalemia - Replace and follow. Check magnesium.  History of remote TIA - No strokelike symptoms. CT and MRI brain negative for acute stroke. Continue Plavix and aspirin.  Headache - Seems to have resolved.  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  Subcentimeter meningioma posterior Falx - As per radiology report, unchanged from prior studies. Outpatient follow-up.   DVT prophylaxis: Lovenox Code Status: Full Family Communication: Discussed with patient. No family at bedside. Disposition Plan: DC home when medically stable. Possibly  7/13.   Consultants:   None  Procedures:   None  Antimicrobials:   None    Subjective: States that he feels slightly better. No headache or vomiting reported. Still has vertigo even when laying down, worsened with head movement side-to-side and sitting up. Denies tinnitus, hearing difficulty, recent URI. No prior history of vertigo.  Objective:  Filed Vitals:   03/17/16 0059 03/17/16 0102 03/17/16 0615 03/17/16 1144  BP:  206/82 175/80 188/78  Pulse:  61 60 62  Temp:  97.6 F (36.4 C) 97.5 F (36.4 C) 97.5 F (36.4 C)  TempSrc:  Oral Oral Oral  Resp:  18    Height: 5\' 10"  (1.778 m) 5\' 10"  (1.778 m)    Weight:  107.094 kg (236 lb 1.6 oz)    SpO2:  99% 95% 99%    Intake/Output Summary (Last 24 hours) at 03/17/16 1215 Last data filed at 03/17/16 0800  Gross per 24 hour  Intake      0 ml  Output    225 ml  Net   -225 ml   Filed Weights   03/17/16 0102  Weight: 107.094 kg (236 lb 1.6 oz)    Examination:  General exam: Pleasant elderly male lying comfortably supine in bed. ENT: No rash or lesions on external auditory canal. Unremarkable exam. Respiratory system: Clear to auscultation. Respiratory effort normal. Cardiovascular system: S1 & S2 heard, RRR. No JVD, murmurs, rubs, gallops or clicks. No pedal edema.  Gastrointestinal system: Abdomen is nondistended, soft and nontender. No organomegaly or masses felt. Normal bowel sounds heard. Central nervous system: Alert and oriented. No focal neurological deficits. No nystagmus appreciated. No pronator drift. Extremities: Symmetric 5 x 5 power.  Skin: No rashes, lesions or ulcers Psychiatry: Judgement and insight appear normal. Mood & affect appropriate.     Data Reviewed: I have personally reviewed following labs and imaging studies  CBC:  Recent Labs Lab 03/16/16 1642 03/16/16 1651  WBC 6.8  --   NEUTROABS 5.5  --   HGB 15.2 15.3  HCT 44.1 45.0  MCV 83.7  --   PLT 231  --    Basic Metabolic  Panel:  Recent Labs Lab 03/16/16 1642 03/16/16 1651  NA 135 137  K 3.4* 3.4*  CL 103 101  CO2 20*  --   GLUCOSE 313* 322*  BUN 13 15  CREATININE 1.16 1.10  CALCIUM 8.7*  --    GFR: Estimated Creatinine Clearance: 70 mL/min (by C-G formula based on Cr of 1.1). Liver Function Tests:  Recent Labs Lab 03/16/16 1642  AST 12*  ALT 15*  ALKPHOS 96  BILITOT 0.8  PROT 6.1*  ALBUMIN 3.6   No results for input(s): LIPASE, AMYLASE in the last 168 hours. No results for input(s): AMMONIA in the last 168 hours. Coagulation Profile:  Recent Labs Lab 03/16/16 1642  INR 1.15   Cardiac Enzymes: No results for input(s): CKTOTAL, CKMB, CKMBINDEX, TROPONINI in the last 168 hours. BNP (last 3 results) No results for input(s): PROBNP in the last 8760 hours. HbA1C: No results for input(s): HGBA1C in the last 72 hours. CBG:  Recent Labs Lab 03/16/16 2054 03/17/16 0114 03/17/16 0607 03/17/16 1139  GLUCAP 269* 271* 220* 223*   Lipid Profile: No results for input(s): CHOL, HDL, LDLCALC, TRIG, CHOLHDL, LDLDIRECT in the last 72 hours. Thyroid Function Tests: No results for input(s): TSH, T4TOTAL, FREET4, T3FREE, THYROIDAB in the last 72 hours. Anemia Panel: No results for input(s): VITAMINB12, FOLATE, FERRITIN, TIBC, IRON, RETICCTPCT in the last 72 hours.  Sepsis Labs: No results for input(s): PROCALCITON, LATICACIDVEN in the last 168 hours.  No results found for this or any previous visit (from the past 240 hour(s)).       Radiology Studies: Ct Head Wo Contrast  03/16/2016  CLINICAL DATA:  Dizziness with gait disturbance EXAM: CT HEAD WITHOUT CONTRAST TECHNIQUE: Contiguous axial images were obtained from the base of the skull through the vertex without intravenous contrast. COMPARISON:  July 07, 2010 head CT and April 23, 2015 brain MRI FINDINGS: Brain: There is extensive generalized atrophy, unchanged. There is no demonstrable intracranial mass, acute hemorrhage,  extra-axial fluid collection, or midline shift. There is slight periventricular small vessel disease in the centra semiovale bilaterally with small lacunar infarcts in each inferior centrum semiovale, stable. There is no acute infarct evident. Vascular: There are scattered foci of calcification in both distal vertebral arteries bilaterally. Skull: The bony calvarium appears intact. Sinuses/Orbits: Folds appear symmetric bilaterally. There are retention cysts in each maxillary antrum. Other paranasal sinuses are clear. Other: The orbits appear symmetric bilaterally. There is debris in the left external auditory canal. IMPRESSION: Stable extensive atrophy. Small lacunar infarcts in each centrum semiovale with slight periventricular small vessel disease, stable. No acute infarct evident. No hemorrhage, mass, or extra-axial fluid collection. There is calcification in each distal vertebral artery. There are retention cysts in each maxillary antrum. There is probable cerumen in the left external auditory canal. Electronically Signed   By: Lowella Grip III M.D.   On: 03/16/2016 17:31   Mr Brain Wo Contrast  03/16/2016  CLINICAL DATA:  Vertigo EXAM: MRI HEAD WITHOUT CONTRAST TECHNIQUE: Multiplanar, multiecho pulse sequences of the brain  and surrounding structures were obtained without intravenous contrast. COMPARISON:  CT head 03/16/2016. MRI 04/23/2015. CT head 07/07/2010 FINDINGS: Generalized atrophy. Diffuse ventricular prominence is unchanged from 04/23/2015. 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 Hyperintensity in the pons due to chronic ischemia. Chronic infarct left cerebellum unchanged. Negative for acute infarct. 6 x 8 mm extra-axial mass along the posterior falx on the left just above the tentorium is unchanged from the prior MRI. This shows high signal on diffusion and intermediate  signal on T2. On axial T1 imaging this appears extra-axial and is most consistent with meningioma. This is noted to be hyperdense on CT and is probably calcified. This is unchanged from CT of 07/07/2010. Negative for hemorrhage or fluid collection. No shift of the midline structures. Retention cysts in the maxillary sinus bilaterally. Normal orbit. Pituitary not enlarged. IMPRESSION: Negative for acute infarction Diffuse ventricular enlargement is unchanged most consistent with atrophy Subcentimeter meningioma posterior falx is unchanged from prior studies. This appears calcified on CT. Electronically Signed   By: Franchot Gallo M.D.   On: 03/16/2016 19:34        Scheduled Meds: . [START ON 03/18/2016] amLODipine  5 mg Oral Daily  . aspirin  81 mg Oral Daily  . atorvastatin  20 mg Oral q1800  . [START ON 03/18/2016] benazepril  40 mg Oral Daily  . clopidogrel  75 mg Oral Daily  . darifenacin  7.5 mg Oral Daily  . enoxaparin (LOVENOX) injection  55 mg Subcutaneous Q24H  . fesoterodine  8 mg Oral Daily  . insulin aspart  0-15 Units Subcutaneous TID WC  . insulin aspart  0-5 Units Subcutaneous QHS  . ketorolac  1 drop Left Eye Daily  . meclizine  25 mg Oral TID  . pantoprazole  40 mg Oral Daily  . potassium chloride SA  20 mEq Oral Daily  . sodium chloride flush  3 mL Intravenous Q12H  . tamsulosin  0.4 mg Oral QHS   Continuous Infusions:        Time spent: 45 minutes.    [facility], MD [facility] Pager [phone]  If 7PM-7AM, please contact night-coverage [email or web address] Password [facility] 03/17/2016, 12:15 PM

## 2016-03-18 DIAGNOSIS — R42 Dizziness and giddiness: Secondary | ICD-10-CM | POA: Diagnosis not present

## 2016-03-18 LAB — BASIC METABOLIC PANEL
Anion gap: 7 (ref 5–15)
Anion gap: 5 (ref 5–15)
BUN: 9 mg/dL (ref 6–20)
BUN: 9 mg/dL (ref 6–20)
CALCIUM: 8.5 mg/dL — AB (ref 8.9–10.3)
CALCIUM: 8.7 mg/dL — AB (ref 8.9–10.3)
CHLORIDE: 108 mmol/L (ref 101–111)
CHLORIDE: 110 mmol/L (ref 101–111)
CO2: 24 mmol/L (ref 22–32)
CO2: 25 mmol/L (ref 22–32)
CREATININE: 1.14 mg/dL (ref 0.61–1.24)
CREATININE: 1.32 mg/dL — AB (ref 0.61–1.24)
GFR, EST AFRICAN AMERICAN: 59 mL/min — AB (ref >60)
GFR, EST NON AFRICAN AMERICAN: 51 mL/min — AB (ref >60)
Glucose, Bld: 128 mg/dL — ABNORMAL HIGH (ref 65–99)
Glucose, Bld: 190 mg/dL — ABNORMAL HIGH (ref 65–99)
POTASSIUM: 2.9 mmol/L — AB (ref 3.5–5.1)
Potassium: 3.7 mmol/L (ref 3.5–5.1)
SODIUM: 139 mmol/L (ref 135–145)
SODIUM: 140 mmol/L (ref 135–145)

## 2016-03-18 LAB — HEMOGLOBIN A1C
Hgb A1c MFr Bld: 9.3 % — ABNORMAL HIGH (ref 4.8–5.6)
Mean Plasma Glucose: 220 mg/dL

## 2016-03-18 LAB — URINE CULTURE

## 2016-03-18 LAB — GLUCOSE, CAPILLARY
GLUCOSE-CAPILLARY: 168 mg/dL — AB (ref 65–99)
GLUCOSE-CAPILLARY: 146 mg/dL — AB (ref 65–99)
GLUCOSE-CAPILLARY: 115 mg/dL — AB (ref 65–99)
Glucose-Capillary: 110 mg/dL — ABNORMAL HIGH (ref 65–99)

## 2016-03-18 MED ORDER — HYDROCODONE-ACETAMINOPHEN 5-325 MG PO TABS: 1.0000 | ORAL_TABLET | Freq: Four times a day (QID) | ORAL | Status: DC | PRN

## 2016-03-18 MED ORDER — POTASSIUM CHLORIDE CRYS ER 20 MEQ PO TBCR
40.0000 meq | EXTENDED_RELEASE_TABLET | ORAL | Status: AC
  Administered 2016-03-18 (×2): 40 meq via ORAL
  Filled 2016-03-18 (×2): qty 2

## 2016-03-18 NOTE — Progress Notes (Signed)
Physical Therapy Treatment Patient Details Name: Micheal Holt MRN: [id number] DOB: 1940/11/13 Today's Date: 03/18/2016    History of Present Illness pt is a 75 y/o male with h/o dm2, HTN, admitted with vertigo, N/V.  MRI/CT show no acute stroke.  Symptom suggest BPPV.    PT Comments    Pt was seen sitting up in bed with headache. Pt was slow moving due to HA and tended to keep head lowered, not looking up, suspected due to HA. Pt's dizziness and nausea did not increase during therapy. Pt was recommended to use RW to help walking balance. After 3rd Epply maneuver, patient stated he felt "cured." Pt would benefit from further PT in decrease his vertigo symptoms.   Follow Up Recommendations  Outpatient PT (for vertigo and balance)     Equipment Recommendations  None recommended by PT    Recommendations for Other Services       Precautions / Restrictions Precautions Precautions: Fall Restrictions Weight Bearing Restrictions: No    Mobility  Bed Mobility Overal bed mobility: Needs Assistance Bed Mobility: Supine to Sit;Sit to Supine     Supine to sit: Supervision Sit to supine: Supervision   General bed mobility comments: min assist during vestibular/min guard after vestibular  Transfers Overall transfer level: Needs assistance Equipment used: None;Rolling walker (2 wheeled) Transfers: Sit to/from Stand Sit to Stand: Min guard (x2)         General transfer comment: Pt min guard to stand. Increased time needed to stand due to HA and not tyring to make quick movements to increase HA. Pt needed verabal cues when using RW to push off bed with hand.  Ambulation/Gait Ambulation/Gait assistance: Min guard;Min assist Ambulation Distance (Feet): 200 Feet Assistive device: None;Rolling walker (2 wheeled) Gait Pattern/deviations: Staggering left;Staggering right;Step-through pattern     General Gait Details: Pt not steady on feet while walking without DME. Pt staggering left  and right, trying to grab for the railing to steady himself. Pt increased his speed every so often in order to keep himself from falling foward, with LOB x 4. Pt sat back into bed and switched to using a RW after 100 ft to help with his balance. Pt stated he felt more steady with RW and has one at home. 147ft x 2     Stairs            Wheelchair Mobility    Modified Rankin (Stroke Patients Only)       Balance Overall balance assessment: No apparent balance deficits (not formally assessed)  Vestibular:   03/18/16 1544  Vestibular Assessment  General Observation Pt wearing glasses when we entered the room.  Took them off for session. He reports multiple recent eye surgeries.    Symptom Behavior  Type of Dizziness Spinning  Frequency of Dizziness with lying back in the bed  Duration of Dizziness <30 seconds  Aggravating Factors Activity in general  Relieving Factors Lying supine  Occulomotor Exam  Occulomotor Alignment Normal  Positional Testing  Dix-Hallpike Dix-Hallpike Right;Dix-Hallpike Left  Sidelying Test Sidelying Right;Sidelying Left  Horizontal Canal Testing Horizontal Canal Right;Horizontal Canal Left  Dix-Hallpike Right  Dix-Hallpike Right Duration 0  Dix-Hallpike Right Symptoms No nystagmus  Dix-Hallpike Left  Dix-Hallpike Left Duration 10  Dix-Hallpike Left Symptoms Downbeat, right rotatory nystagmus  Sidelying Right  Sidelying Right Duration 0  Sidelying Right Symptoms No nystagmus  Sidelying Left  Sidelying Left Duration 0  Sidelying Left Symptoms No nystagmus  Horizontal Canal Right  Horizontal Canal  Right Duration 0  Horizontal Canal Right Symptoms Normal  Horizontal Canal Left  Horizontal Canal Left Duration 0  Horizontal Canal Left Symptoms Normal  Positional Sensitivities  Positional Sensitivities Comments Preformed Epley's maneuver x 3 with complete resolution of nystagmus on third maneuver.                                       Cognition Arousal/Alertness: Awake/alert Behavior During Therapy: WFL for tasks assessed/performed Overall Cognitive Status: Within Functional Limits for tasks assessed                         General Comments General comments (skin integrity, edema, etc.): Pt had a headache when therapist came in. Pt ambulated before recieving vestibular treatment. Epply maneuver x3 to treat L posterior canal BPPV      Pertinent Vitals/Pain Pain Assessment: Faces Faces Pain Scale: Hurts a little bit Pain Location: HA Pain Descriptors / Indicators: Aching;Pressure           PT Goals (current goals can now be found in the care plan section) Acute Rehab PT Goals Patient Stated Goal: get rid of this vertigo.    Frequency  Min 3X/week    PT Plan         End of Session   Activity Tolerance: Patient tolerated treatment well;No increased pain (decreased dizziness) Patient left: in chair;with call bell/phone within reach;with chair alarm set     Time: [id number] PT Time Calculation (min) (ACUTE ONLY): 50 min  Charges:  $Gait Training: 8-22 mins $Therapeutic Activity: 8-22 mins $Canalith Rep Proc: 8-22 mins                    G CodesBenard Holt [id number]  03/18/2016, 3:59 PM

## 2016-03-18 NOTE — Progress Notes (Addendum)
PROGRESS NOTE  Micheal Holt  [id number] DOB: November 01, 1940  DOA: 03/16/2016 PCP: Leonard Downing, MD   Brief Narrative:  75 y.o. male with history of DM2, HTN, OSA, TIA on plavix, HL who presented with vertigo and n/v to ED. In ED MRI and CT did not show any acute findings.Rec'd antivert but still couldn't walk. Symptoms began on 7/11 at 4 AM. Denies strokelike symptoms or URI symptoms. No prior history of vertigo. Admitted for further evaluation and management.   Assessment & Plan:   Principal Problem:   Vertigo Active Problems:   Uncontrolled hypertension   CKD (chronic kidney disease) stage 2, GFR 60-89 ml/min   Diabetes type 2, uncontrolled (HCC)   Acute Vertigo - CT head and MRI brain without acute stroke. - Orthostatic changes positive (BP 188/78 supine >157/73 standing). However do not believe that this is cause of his vertigo. Patient has symptoms even when lying in bed. No orthostatic changes today. - Possible BPPV. PT input 7/12 appreciated: Marye Round for left side showed significant torsional nystagmus and spinning. Treatment with 2 trials of Epply maneuver with classic results. PT plans to reassess again today. - Slowly improving but does not feel well enough or comfortable to go home yet.  - Neurology consulted.  Essential hypertension, uncontrolled - Continue amlodipine, benazepril. Continue when necessary IV labetalol. Lasix held temporarily.  Uncontrolled DM 2 - Hold oral hypoglycemics while hospitalized. Continue SSI. May need to add low-dose Lantus. Check A1c: 9.3 suggesting poor outpatient control & TSH: 3.292.  Hypokalemia - Replaced. Check magnesium: 1.8.  History of remote TIA - No strokelike symptoms. CT and MRI brain negative for acute stroke. Continue Plavix and aspirin.  Headache - Seems to have resolved.  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 Baseline creatinine may be in the 1.3 range. Follow BMP in  a.m.  Subcentimeter meningioma posterior Falx - As per radiology report, unchanged from prior studies. Outpatient follow-up.  Asymptomatic bacteriuria   DVT prophylaxis: Lovenox Code Status: Full Family Communication: Discussed with patient. No family at bedside. Disposition Plan: DC home when medically stable. Possibly 7/14.   Consultants:   None  Procedures:   None  Antimicrobials:   None    Subjective: Denies nausea or vomiting. States that his vertigo is better but still feels uncomfortable. No headache reported.  Objective:  Filed Vitals:   03/18/16 0135 03/18/16 0630 03/18/16 1006 03/18/16 1330  BP: 165/77 167/85 185/84 172/71  Pulse: 65 76 65 69  Temp: 97.3 F (36.3 C) 97.7 F (36.5 C) 97.8 F (36.6 C) 98.1 F (36.7 C)  TempSrc: Oral Oral Oral Oral  Resp: 18 18 18 18   Height:      Weight:      SpO2: 95% 98% 96% 92%    Intake/Output Summary (Last 24 hours) at 03/18/16 1501 Last data filed at 03/18/16 1300  Gross per 24 hour  Intake    540 ml  Output    795 ml  Net   -255 ml   Filed Weights   03/17/16 0102  Weight: 107.094 kg (236 lb 1.6 oz)    Examination:  General exam: Pleasant elderly male lying comfortably supine in bed. ENT: No rash or lesions on external auditory canal. Unremarkable exam. Respiratory system: Clear to auscultation. Respiratory effort normal. Cardiovascular system: S1 & S2 heard, RRR. No JVD, murmurs, rubs, gallops or clicks. No pedal edema.  Gastrointestinal system: Abdomen is nondistended, soft and nontender. No organomegaly or masses felt.  Normal bowel sounds heard. Central nervous system: Alert and oriented. No focal neurological deficits. No nystagmus appreciated on my exam. No pronator drift. Extremities: Symmetric 5 x 5 power. Skin: No rashes, lesions or ulcers Psychiatry: Judgement and insight appear normal. Mood & affect appropriate.     Data Reviewed: I have personally reviewed following labs and imaging  studies  CBC:  Recent Labs Lab 03/16/16 1642 03/16/16 1651  WBC 6.8  --   NEUTROABS 5.5  --   HGB 15.2 15.3  HCT 44.1 45.0  MCV 83.7  --   PLT 231  --    Basic Metabolic Panel:  Recent Labs Lab 03/16/16 1642 03/16/16 1651 03/17/16 1232 03/18/16 0419 03/18/16 1355  NA 135 137  --  139 140  K 3.4* 3.4*  --  2.9* 3.7  CL 103 101  --  108 110  CO2 20*  --   --  24 25  GLUCOSE 313* 322*  --  128* 190*  BUN 13 15  --  9 9  CREATININE 1.16 1.10  --  1.14 1.32*  CALCIUM 8.7*  --   --  8.5* 8.7*  MG  --   --  1.8  --   --    GFR: Estimated Creatinine Clearance: 58.3 mL/min (by C-G formula based on Cr of 1.32). Liver Function Tests:  Recent Labs Lab 03/16/16 1642  AST 12*  ALT 15*  ALKPHOS 96  BILITOT 0.8  PROT 6.1*  ALBUMIN 3.6   No results for input(s): LIPASE, AMYLASE in the last 168 hours. No results for input(s): AMMONIA in the last 168 hours. Coagulation Profile:  Recent Labs Lab 03/16/16 1642  INR 1.15   Cardiac Enzymes: No results for input(s): CKTOTAL, CKMB, CKMBINDEX, TROPONINI in the last 168 hours. BNP (last 3 results) No results for input(s): PROBNP in the last 8760 hours. HbA1C:  Recent Labs  03/17/16 1232  HGBA1C 9.3*   CBG:  Recent Labs Lab 03/17/16 1139 03/17/16 1631 03/17/16 2151 03/18/16 0635 03/18/16 1131  GLUCAP 223* 133* 225* 110* 168*   Lipid Profile: No results for input(s): CHOL, HDL, LDLCALC, TRIG, CHOLHDL, LDLDIRECT in the last 72 hours. Thyroid Function Tests:  Recent Labs  03/17/16 1232  TSH 3.292   Anemia Panel: No results for input(s): VITAMINB12, FOLATE, FERRITIN, TIBC, IRON, RETICCTPCT in the last 72 hours.  Sepsis Labs: No results for input(s): PROCALCITON, LATICACIDVEN in the last 168 hours.  Recent Results (from the past 240 hour(s))  Urine culture     Status: Abnormal   Collection Time: 03/16/16  4:50 PM  Result Value Ref Range Status   Specimen Description URINE, RANDOM  Final   Special  Requests NONE  Final   Culture <10,000 COLONIES/mL INSIGNIFICANT GROWTH (A)  Final   Report Status 03/18/2016 FINAL  Final         Radiology Studies: Ct Head Wo Contrast  03/16/2016  CLINICAL DATA:  Dizziness with gait disturbance EXAM: CT HEAD WITHOUT CONTRAST TECHNIQUE: Contiguous axial images were obtained from the base of the skull through the vertex without intravenous contrast. COMPARISON:  July 07, 2010 head CT and April 23, 2015 brain MRI FINDINGS: Brain: There is extensive generalized atrophy, unchanged. There is no demonstrable intracranial mass, acute hemorrhage, extra-axial fluid collection, or midline shift. There is slight periventricular small vessel disease in the centra semiovale bilaterally with small lacunar infarcts in each inferior centrum semiovale, stable. There is no acute infarct evident. Vascular: There are scattered foci of  calcification in both distal vertebral arteries bilaterally. Skull: The bony calvarium appears intact. Sinuses/Orbits: Folds appear symmetric bilaterally. There are retention cysts in each maxillary antrum. Other paranasal sinuses are clear. Other: The orbits appear symmetric bilaterally. There is debris in the left external auditory canal. IMPRESSION: Stable extensive atrophy. Small lacunar infarcts in each centrum semiovale with slight periventricular small vessel disease, stable. No acute infarct evident. No hemorrhage, mass, or extra-axial fluid collection. There is calcification in each distal vertebral artery. There are retention cysts in each maxillary antrum. There is probable cerumen in the left external auditory canal. Electronically Signed   By: Lowella Grip III M.D.   On: 03/16/2016 17:31   Mr Brain Wo Contrast  03/16/2016  CLINICAL DATA:  Vertigo EXAM: MRI HEAD WITHOUT CONTRAST TECHNIQUE: Multiplanar, multiecho pulse sequences of the brain and surrounding structures were obtained without intravenous contrast. COMPARISON:  CT head  03/16/2016. MRI 04/23/2015. CT head 07/07/2010 FINDINGS: Generalized atrophy. Diffuse ventricular prominence is unchanged from 04/23/2015. 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 Hyperintensity in the pons due to chronic ischemia. Chronic infarct left cerebellum unchanged. Negative for acute infarct. 6 x 8 mm extra-axial mass along the posterior falx on the left just above the tentorium is unchanged from the prior MRI. This shows high signal on diffusion and intermediate signal on T2. On axial T1 imaging this appears extra-axial and is most consistent with meningioma. This is noted to be hyperdense on CT and is probably calcified. This is unchanged from CT of 07/07/2010. Negative for hemorrhage or fluid collection. No shift of the midline structures. Retention cysts in the maxillary sinus bilaterally. Normal orbit. Pituitary not enlarged. IMPRESSION: Negative for acute infarction Diffuse ventricular enlargement is unchanged most consistent with atrophy Subcentimeter meningioma posterior falx is unchanged from prior studies. This appears calcified on CT. Electronically Signed   By: Franchot Gallo M.D.   On: 03/16/2016 19:34        Scheduled Meds: . amLODipine  5 mg Oral Daily  . aspirin  81 mg Oral Daily  . atorvastatin  20 mg Oral q1800  . benazepril  40 mg Oral Daily  . clopidogrel  75 mg Oral Daily  . darifenacin  7.5 mg Oral Daily  . enoxaparin (LOVENOX) injection  55 mg Subcutaneous Q24H  . fesoterodine  8 mg Oral Daily  . insulin aspart  0-15 Units Subcutaneous TID WC  . insulin aspart  0-5 Units Subcutaneous QHS  . ketorolac  1 drop Left Eye Daily  . meclizine  25 mg Oral TID  . pantoprazole  40 mg Oral Daily  . sodium chloride flush  3 mL Intravenous Q12H  . tamsulosin  0.4 mg Oral QHS   Continuous Infusions:        Time spent: 25  minutes.    [facility], MD [facility] Pager [phone]  If 7PM-7AM, please contact night-coverage [email or web address] Password [facility] 03/18/2016, 3:01 PM

## 2016-03-18 NOTE — Care Management Note (Signed)
Case Management Note  Patient Details  Name: Micheal Holt MRN: [id number] Date of Birth: [birth year]  Subjective/Objective:  Pt in with Vertigo with negative MRI. He is from home with his wife.                   Action/Plan: PT last recommendation was for outpatient therapy. CM following for discharge needs.   Expected Discharge Date:  03/18/16               Expected Discharge Plan:  Home/Self Care  In-House Referral:     Discharge planning Services     Post Acute Care Choice:    Choice offered to:     DME Arranged:    DME Agency:     HH Arranged:    HH Agency:     Status of Service:  In process, will continue to follow  If discussed at Long Length of Stay Meetings, dates discussed:    Additional Comments:  Pollie Friar, RN 03/18/2016, 11:12 AM

## 2016-03-19 DIAGNOSIS — E1121 Type 2 diabetes mellitus with diabetic nephropathy: Secondary | ICD-10-CM

## 2016-03-19 DIAGNOSIS — E1165 Type 2 diabetes mellitus with hyperglycemia: Secondary | ICD-10-CM

## 2016-03-19 DIAGNOSIS — R42 Dizziness and giddiness: Secondary | ICD-10-CM | POA: Diagnosis not present

## 2016-03-19 DIAGNOSIS — Z794 Long term (current) use of insulin: Secondary | ICD-10-CM

## 2016-03-19 DIAGNOSIS — I10 Essential (primary) hypertension: Secondary | ICD-10-CM | POA: Diagnosis not present

## 2016-03-19 LAB — BASIC METABOLIC PANEL
Anion gap: 10 (ref 5–15)
BUN: 11 mg/dL (ref 6–20)
CO2: 21 mmol/L — ABNORMAL LOW (ref 22–32)
Calcium: 8.5 mg/dL — ABNORMAL LOW (ref 8.9–10.3)
Chloride: 106 mmol/L (ref 101–111)
Creatinine, Ser: 1.23 mg/dL (ref 0.61–1.24)
GFR calc Af Amer: >60 mL/min (ref >60)
GFR, EST NON AFRICAN AMERICAN: 55 mL/min — AB (ref >60)
GLUCOSE: 108 mg/dL — AB (ref 65–99)
POTASSIUM: 3.3 mmol/L — AB (ref 3.5–5.1)
Sodium: 137 mmol/L (ref 135–145)

## 2016-03-19 LAB — GLUCOSE, CAPILLARY
Glucose-Capillary: 113 mg/dL — ABNORMAL HIGH (ref 65–99)
Glucose-Capillary: 214 mg/dL — ABNORMAL HIGH (ref 65–99)

## 2016-03-19 MED ORDER — POTASSIUM CHLORIDE CRYS ER 20 MEQ PO TBCR: 40.0000 meq | EXTENDED_RELEASE_TABLET | Freq: Every day | ORAL | Status: DC

## 2016-03-19 MED ORDER — MECLIZINE HCL 25 MG PO TABS: 25.0000 mg | ORAL_TABLET | Freq: Three times a day (TID) | ORAL | Status: DC | PRN

## 2016-03-19 MED ORDER — POTASSIUM CHLORIDE CRYS ER 20 MEQ PO TBCR
40.0000 meq | EXTENDED_RELEASE_TABLET | Freq: Once | ORAL | Status: AC
Start: 2016-03-19 — End: 2016-03-19
  Administered 2016-03-19: 40 meq via ORAL
  Filled 2016-03-19: qty 2

## 2016-03-19 NOTE — Progress Notes (Signed)
Pt being discharged home via wheelchair with family. Pt alert and oriented x4. VSS. Pt c/o no pain at this time. No signs of respiratory distress. Education complete and care plans resolved. IV removed with catheter intact and pt tolerated well. No further issues at this time. Pt to follow up with PCP. Havier Deeb R, RN 

## 2016-03-19 NOTE — Discharge Instructions (Signed)
Benign Positional Vertigo Vertigo is the feeling that you or your surroundings are moving when they are not. Benign positional vertigo is the most common form of vertigo. The cause of this condition is not serious (is benign). This condition is triggered by certain movements and positions (is positional). This condition can be dangerous if it occurs while you are doing something that could endanger you or others, such as driving.  CAUSES In many cases, the cause of this condition is not known. It may be caused by a disturbance in an area of the inner ear that helps your brain to sense movement and balance. This disturbance can be caused by a viral infection (labyrinthitis), head injury, or repetitive motion. RISK FACTORS This condition is more likely to develop in:  Women.  People who are 50 years of age or older. SYMPTOMS Symptoms of this condition usually happen when you move your head or your eyes in different directions. Symptoms may start suddenly, and they usually last for less than a minute. Symptoms may include:  Loss of balance and falling.  Feeling like you are spinning or moving.  Feeling like your surroundings are spinning or moving.  Nausea and vomiting.  Blurred vision.  Dizziness.  Involuntary eye movement (nystagmus). Symptoms can be mild and cause only slight annoyance, or they can be severe and interfere with daily life. Episodes of benign positional vertigo may return (recur) over time, and they may be triggered by certain movements. Symptoms may improve over time. DIAGNOSIS This condition is usually diagnosed by medical history and a physical exam of the head, neck, and ears. You may be referred to a health care provider who specializes in ear, nose, and throat (ENT) problems (otolaryngologist) or a provider who specializes in disorders of the nervous system (neurologist). You may have additional testing, including:  MRI.  A CT scan.  Eye movement tests. Your  health care provider may ask you to change positions quickly while he or she watches you for symptoms of benign positional vertigo, such as nystagmus. Eye movement may be tested with an electronystagmogram (ENG), caloric stimulation, the Dix-Hallpike test, or the roll test.  An electroencephalogram (EEG). This records electrical activity in your brain.  Hearing tests. TREATMENT Usually, your health care provider will treat this by moving your head in specific positions to adjust your inner ear back to normal. Surgery may be needed in severe cases, but this is rare. In some cases, benign positional vertigo may resolve on its own in 2-4 weeks. HOME CARE INSTRUCTIONS Safety  Move slowly.Avoid sudden body or head movements.  Avoid driving.  Avoid operating heavy machinery.  Avoid doing any tasks that would be dangerous to you or others if a vertigo episode would occur.  If you have trouble walking or keeping your balance, try using a cane for stability. If you feel dizzy or unstable, sit down right away.  Return to your normal activities as told by your health care provider. Ask your health care provider what activities are safe for you. General Instructions  Take over-the-counter and prescription medicines only as told by your health care provider.  Avoid certain positions or movements as told by your health care provider.  Drink enough fluid to keep your urine clear or pale yellow.  Keep all follow-up visits as told by your health care provider. This is important. SEEK MEDICAL CARE IF:  You have a fever.  Your condition gets worse or you develop new symptoms.  Your family or friends   notice any behavioral changes.  Your nausea or vomiting gets worse.  You have numbness or a "pins and needles" sensation. SEEK IMMEDIATE MEDICAL CARE IF:  You have difficulty speaking or moving.  You are always dizzy.  You faint.  You develop severe headaches.  You have weakness in your  legs or arms.  You have changes in your hearing or vision.  You develop a stiff neck.  You develop sensitivity to light.   This information is not intended to replace advice given to you by your health care provider. Make sure you discuss any questions you have with your health care provider.   Document Released: 05/31/2006 Document Revised: 05/14/2015 Document Reviewed: 12/16/2014 Elsevier Interactive Patient Education 2016 [employer]  

## 2016-03-19 NOTE — Progress Notes (Signed)
Physical Therapy Treatment Patient Details Name: Micheal Holt MRN: [id number] DOB: 25-Apr-1941 Today's Date: 03/19/2016    History of Present Illness pt is a 75 y/o male with h/o dm2, HTN, admitted with vertigo, N/V.  MRI/CT show no acute stroke.  Symptom suggest BPPV.    PT Comments    Pt seen in bed with nurse tech taking orthostatic vitals. PT took over taking vitals. Pt positive for orthostatic hypotension, SBP ranged from 180s and dropped to 140s,  but asymptomatic.  Pt feels his vertigo is gone, L dix hall pike negative. Pt still has some walking balance deficits with staggering left and right. Balance does improve with increased walking distance. Pt would benefit from continued PT to focus on balance deficits and if vertigo symptoms return.   Follow Up Recommendations  Outpatient PT (for vertigo and balance)     Equipment Recommendations  None recommended by PT    Recommendations for Other Services       Precautions / Restrictions Precautions Precautions: Fall Restrictions Weight Bearing Restrictions: No    Mobility  Bed Mobility Overal bed mobility: Needs Assistance Bed Mobility: Supine to Sit;Sit to Supine     Supine to sit: Modified independent (Device/Increase time) Sit to supine: Modified independent (Device/Increase time)   General bed mobility comments: Pt had increased time to move in bed, but did not require help to move  Transfers Overall transfer level: Needs assistance Equipment used: None Transfers: Sit to/from Stand Sit to Stand: Supervision         General transfer comment: Pt supervision to stand. No verbal cues needed to stand.  Ambulation/Gait Ambulation/Gait assistance: Min guard Ambulation Distance (Feet): 100 Feet Assistive device: None;Rolling walker (2 wheeled) Gait Pattern/deviations: Staggering left;Staggering right;Step-through pattern     General Gait Details: Pt slightly more steady on feet than last treatment. No RW used,  but brought just in case. Pt with slightt stagger left and right going to stairs, but not noticible after stairs.    Stairs Stairs: Yes Stairs assistance: Min guard Stair Management: Two rails;Step to pattern Number of Stairs: 10 General stair comments: Pt used rails to steady himself for balance. Pt used step to pattern climbing and descending stairs.   Wheelchair Mobility    Modified Rankin (Stroke Patients Only)       Balance Overall balance assessment: Needs assistance Standing balance support: No upper extremity supported Standing balance-Leahy Scale: Good Standing balance comment: Pt was able to stand alone with no apparent balance deficit during orthostatic hypotension vitals  Vestibular    03/19/16 1240  Vestibular Assessment  General Observation Pt reports no episodes of spinning since he saw [place] yesterday.    Dix-Hallpike Left  Dix-Hallpike Left Duration 0  Dix-Hallpike Left Symptoms No nystagmus                       Cognition Arousal/Alertness: Awake/alert Behavior During Therapy: WFL for tasks assessed/performed Overall Cognitive Status: Within Functional Limits for tasks assessed                      Exercises      General Comments General comments (skin integrity, edema, etc.): Pt had slight headache during treatment, but pt stated it was not affecting him. Pt recieced vestibular treatment. L dix hallpike x1 to test for L posterior BPPV. Test negative      Pertinent Vitals/Pain Pain Assessment: Faces Faces Pain Scale: Hurts a little bit Pain Location: HA Pain  Descriptors / Indicators: Dull (only slight HA) Pain Intervention(s): Monitored during session    Home Living                      Prior Function            PT Goals (current goals can now be found in the care plan section) Acute Rehab PT Goals Patient Stated Goal: get rid of this vertigo.    Frequency  Min 3X/week    PT Plan      Co-evaluation              End of Session   Activity Tolerance: Patient tolerated treatment well;No increased pain Patient left: in chair;with call bell/phone within reach;with chair alarm set     Time: [phone] PT Time Calculation (min) (ACUTE ONLY): 23 min  Charges:                       G CodesNash Mantis, [place] [phone]  03/19/2016, 2:21 PM

## 2016-03-19 NOTE — Care Management Note (Signed)
Case Management Note  Patient Details  Name: NEDIM OKI MRN: [id number] Date of Birth: 03-30-1941  Subjective/Objective:                    Action/Plan: Pt discharging home with recommendation for outpatient therapy. CM met with the patient and asked him about outpatient therapy at the [facility] Pt was interested and has transportation to the appointments. CM entered orders in EPIC and placed information on the AVS. Will update the bedside RN.   Expected Discharge Date:  03/18/16               Expected Discharge Plan:  Home/Self Care  In-House Referral:     Discharge planning Services  CM Consult  Post Acute Care Choice:    Choice offered to:     DME Arranged:    DME Agency:     HH Arranged:    HH Agency:     Status of Service:  Completed, signed off  If discussed at [employer] of Stay Meetings, dates discussed:    Additional Comments:  Pollie Friar, RN 03/19/2016, 11:39 AM

## 2016-03-19 NOTE — Discharge Summary (Signed)
Physician Discharge Summary  Micheal Holt [id number] DOB: 13-May-1941  PCP: Leonard Downing, MD  Admit date: 03/16/2016 Discharge date: 03/19/2016  Admitted From: Home Disposition:  Home  Recommendations for Outpatient Follow-up:  1. Dr. Claris Gower, PCP in 5 days with repeat labs (CBC & BMP). Will need evaluation and adjustment of DM and HTN medications.  2. May consider outpatient ENT consultation.  Home Health: Outpatient PT (Vestibular rehabilitation) Equipment/Devices: None    Discharge Condition: Improved and stable.  CODE STATUS: Full  Diet recommendation: Heart healthy and diabetic diet.  Discharge Diagnoses:  Principal Problem:   Vertigo Active Problems:   Uncontrolled hypertension   CKD (chronic kidney disease) stage 2, GFR 60-89 ml/min   Diabetes type 2, uncontrolled (HCC)   Brief/Interim Summary: 75 y.o. male with history of DM2, HTN, OSA, TIA on plavix, HL who presented with vertigo and n/v to ED. In ED MRI and CT did not show any acute findings.Rec'd antivert but still couldn't walk. Symptoms began on 7/11 at 4 AM. Denies strokelike symptoms or URI symptoms. No prior history of vertigo. Admitted for further evaluation and management.   Assessment & Plan:  Principal Problem:  Vertigo Active Problems:  Uncontrolled hypertension  CKD (chronic kidney disease) stage 2, GFR 60-89 ml/min  Diabetes type 2, uncontrolled (HCC)   Acute Vertigo - CT head and MRI brain without acute stroke. - Possible BPPV. PT input 7/12 appreciated: Marye Round for left side showed significant torsional nystagmus and spinning. Treatment with 2 trials of Epply maneuver with classic results.  - Per PT and reassessment today, vertigo has resolved. PT recommends outpatient PT for further vestibular rehabilitation. - May consider outpatient ENT consultation if patient has recurrence or worsening. Patient verbalized understanding.  Essential hypertension,  uncontrolled/orthostatic hypotension - Continue amlodipine, benazepril.  - Patient has supine uncontrolled hypertension and intermittent orthostatic hypotension of which she is not symptomatic. Continue home dose of medications and advised to follow-up closely with PCP regarding further adjustment. Increasing amlodipine dose may make his orthostatic hypotension worse. May consider beta blockers. Patient was also advised regarding maneuvers and lifestyle changes to minimize symptoms from orthostatic blood pressure changes and she verbalized understanding. BP 186/83 supine >171/77 standing and 148/91 after standing for 3 minutes. - Lasix which was temporarily held in the hospital will be resumed at discharge.  Uncontrolled DM 2 - A1c: 9.3 suggesting poor outpatient control & TSH: 3.292. - Oral hypoglycemics were held and patient was treated with SSI in the hospital. Resumed oral hypoglycemics at discharge and will need further adjustment as outpatient.  Hypokalemia - Replaced prior to discharge. Check magnesium: 1.8. Due to recurrent nature of hypokalemia, increased potassium supplements at discharge. Follow-up with repeat BMP in a few days as outpatient.  History of remote TIA - No strokelike symptoms. CT and MRI brain negative for acute stroke. Continue Plavix and aspirin.  Headache - resolved.  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 Baseline creatinine may be in the 1.3 range. Stable. Outpatient follow-up.  Subcentimeter meningioma posterior Falx - As per radiology report, unchanged from prior studies. Outpatient follow-up.  Asymptomatic bacteriuria   Consultants:   None  Procedures:   None  Discharge Instructions      Discharge Instructions    (HEART FAILURE PATIENTS) Call MD:  Anytime you have any of the following symptoms: 1) 3 pound weight gain in 24 hours or 5 pounds in 1 week 2) shortness of breath, with or without a dry hacking cough  3) swelling in  the hands, feet or stomach 4) if you have to sleep on extra pillows at night in order to breathe.    Complete by:  As directed      Ambulatory referral to Physical Therapy    Complete by:  As directed      Call MD for:  extreme fatigue    Complete by:  As directed      Call MD for:  persistant dizziness or light-headedness    Complete by:  As directed      Call MD for:  severe uncontrolled pain    Complete by:  As directed      Call MD for:    Complete by:  As directed   Vertigo.     Diet - low sodium heart healthy    Complete by:  As directed      Diet Carb Modified    Complete by:  As directed      Increase activity slowly    Complete by:  As directed             Medication List    STOP taking these medications        omeprazole 40 MG capsule  Commonly known as:  PRILOSEC      TAKE these medications        amLODipine 5 MG tablet  Commonly known as:  NORVASC  TAKE 1 TABLET BY MOUTH DAILY     aspirin 81 MG tablet  Take 81 mg by mouth daily.     atorvastatin 20 MG tablet  Commonly known as:  LIPITOR  TAKE 1 TABLET BY MOUTH ONCE DAILY     benazepril 40 MG tablet  Commonly known as:  LOTENSIN  Take 1 tablet (40 mg total) by mouth daily. for high blood pressure     clopidogrel 75 MG tablet  Commonly known as:  PLAVIX  TAKE 1 TABLET BY MOUTH DAILY     furosemide 40 MG tablet  Commonly known as:  LASIX  TAKE 1 TABLET BY MOUTH DAILY. MAY INCREASE TO 2 TABLETS DAILY IF WEIGHT INCREASES TO MORE THAN 2 POUNDS FROM BASELINE     glimepiride 4 MG tablet  Commonly known as:  AMARYL  Take 4 mg by mouth daily.     meclizine 25 MG tablet  Commonly known as:  ANTIVERT  Take 1 tablet (25 mg total) by mouth 3 (three) times daily as needed for dizziness.     metFORMIN 1000 MG tablet  Commonly known as:  GLUCOPHAGE  Take 1,000 mg by mouth daily.     pantoprazole 40 MG tablet  Commonly known as:  PROTONIX  TAKE 1 TABLET BY MOUTH DAILY     potassium chloride SA 20 MEQ  tablet  Commonly known as:  KLOR-CON M20  Take 2 tablets (40 mEq total) by mouth daily.     PROLENSA 0.07 % Soln  Generic drug:  Bromfenac Sodium  Place 1 drop into the left eye daily.     solifenacin 10 MG tablet  Commonly known as:  VESICARE  Take 10 mg by mouth daily.     tamsulosin 0.4 MG Caps capsule  Commonly known as:  FLOMAX  TAKE 1 CAPSULE BY MOUTH EVERY NIGHT AT BEDTIME     TOVIAZ 8 MG Tb24 tablet  Generic drug:  fesoterodine  Take 8 mg by mouth daily.       Follow-up Information    Follow up with [facility].   Specialty:  Rehabilitation   Why:  They will contact you to set up the first appointment.   Contact information:   [address] [place] [id number] [place] [id number] [phone]      Follow up with Leonard Downing, MD. Schedule an appointment as soon as possible for a visit in 5 days.   Specialty:  Family Medicine   Why:  To be seen with repeat labs (CBC & BMP).   Contact information:   [place] [address] [phone]      No Known Allergies   Procedures/Studies: Ct Head Wo Contrast  03/16/2016  CLINICAL DATA:  Dizziness with gait disturbance EXAM: CT HEAD WITHOUT CONTRAST TECHNIQUE: Contiguous axial images were obtained from the base of the skull through the vertex without intravenous contrast. COMPARISON:  July 07, 2010 head CT and April 23, 2015 brain MRI FINDINGS: Brain: There is extensive generalized atrophy, unchanged. There is no demonstrable intracranial mass, acute hemorrhage, extra-axial fluid collection, or midline shift. There is slight periventricular small vessel disease in the centra semiovale bilaterally with small lacunar infarcts in each inferior centrum semiovale, stable. There is no acute infarct evident. Vascular: There are scattered foci of calcification in both distal vertebral arteries bilaterally. Skull: The bony calvarium appears  intact. Sinuses/Orbits: Folds appear symmetric bilaterally. There are retention cysts in each maxillary antrum. Other paranasal sinuses are clear. Other: The orbits appear symmetric bilaterally. There is debris in the left external auditory canal. IMPRESSION: Stable extensive atrophy. Small lacunar infarcts in each centrum semiovale with slight periventricular small vessel disease, stable. No acute infarct evident. No hemorrhage, mass, or extra-axial fluid collection. There is calcification in each distal vertebral artery. There are retention cysts in each maxillary antrum. There is probable cerumen in the left external auditory canal. Electronically Signed   By: Lowella Grip III M.D.   On: 03/16/2016 17:31   Mr Brain Wo Contrast  03/16/2016  CLINICAL DATA:  Vertigo EXAM: MRI HEAD WITHOUT CONTRAST TECHNIQUE: Multiplanar, multiecho pulse sequences of the brain and surrounding structures were obtained without intravenous contrast. COMPARISON:  CT head 03/16/2016. MRI 04/23/2015. CT head 07/07/2010 FINDINGS: Generalized atrophy. Diffuse ventricular prominence is unchanged from 04/23/2015. 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 Hyperintensity in the pons due to chronic ischemia. Chronic infarct left cerebellum unchanged. Negative for acute infarct. 6 x 8 mm extra-axial mass along the posterior falx on the left just above the tentorium is unchanged from the prior MRI. This shows high signal on diffusion and intermediate signal on T2. On axial T1 imaging this appears extra-axial and is most consistent with meningioma. This is noted to be hyperdense on CT and is probably calcified. This is unchanged from CT of 07/07/2010. Negative for hemorrhage or fluid collection. No shift of the midline structures. Retention cysts in the maxillary sinus bilaterally. Normal orbit. Pituitary not enlarged.  IMPRESSION: Negative for acute infarction Diffuse ventricular enlargement is unchanged most consistent with atrophy Subcentimeter meningioma posterior falx is unchanged from prior studies. This appears calcified on CT. Electronically Signed   By: Franchot Gallo M.D.   On: 03/16/2016 19:34      Subjective: No further vertigo, headache, nausea or vomiting since yesterday afternoon. Denies dizziness or lightheadedness on standing or ambulating. No chest pain, dyspnea or palpitations reported.  Discharge Exam:  Filed Vitals:   03/19/16 0549 03/19/16 1005 03/19/16 1221  03/19/16 1239  BP: 196/83 208/88  181/95  Pulse: 63 66  86  Temp: 97.8 F (36.6 C) 98.7 F (37.1 C)    TempSrc: Oral Oral    Resp: 17 20    Height:      Weight:      SpO2: 97% 97% 97%     General exam: Pleasant elderly male lying comfortably supine in bed. ENT: No rash or lesions on external auditory canal. Unremarkable exam. Respiratory system: Clear to auscultation. Respiratory effort normal. Cardiovascular system: S1 & S2 heard, RRR. No JVD, murmurs, rubs, gallops or clicks. No pedal edema.  Gastrointestinal system: Abdomen is nondistended, soft and nontender. No organomegaly or masses felt. Normal bowel sounds heard. Central nervous system: Alert and oriented. No focal neurological deficits. No nystagmus appreciated on my exam. No pronator drift. Extremities: Symmetric 5 x 5 power. Skin: No rashes, lesions or ulcers Psychiatry: Judgement and insight appear normal. Mood & affect appropriate.    The results of significant diagnostics from this hospitalization (including imaging, microbiology, ancillary and laboratory) are listed below for reference.     Microbiology: Recent Results (from the past 240 hour(s))  Urine culture     Status: Abnormal   Collection Time: 03/16/16  4:50 PM  Result Value Ref Range Status   Specimen Description URINE, RANDOM  Final   Special Requests NONE  Final   Culture <10,000  COLONIES/mL INSIGNIFICANT GROWTH (A)  Final   Report Status 03/18/2016 FINAL  Final     Labs: BNP (last 3 results) No results for input(s): BNP in the last 8760 hours. Basic Metabolic Panel:  Recent Labs Lab 03/16/16 1642 03/16/16 1651 03/17/16 1232 03/18/16 0419 03/18/16 1355 03/19/16 0342  NA 135 137  --  139 140 137  K 3.4* 3.4*  --  2.9* 3.7 3.3*  CL 103 101  --  108 110 106  CO2 20*  --   --  24 25 21*  GLUCOSE 313* 322*  --  128* 190* 108*  BUN 13 15  --  9 9 11   CREATININE 1.16 1.10  --  1.14 1.32* 1.23  CALCIUM 8.7*  --   --  8.5* 8.7* 8.5*  MG  --   --  1.8  --   --   --    Liver Function Tests:  Recent Labs Lab 03/16/16 1642  AST 12*  ALT 15*  ALKPHOS 96  BILITOT 0.8  PROT 6.1*  ALBUMIN 3.6   No results for input(s): LIPASE, AMYLASE in the last 168 hours. No results for input(s): AMMONIA in the last 168 hours. CBC:  Recent Labs Lab 03/16/16 1642 03/16/16 1651  WBC 6.8  --   NEUTROABS 5.5  --   HGB 15.2 15.3  HCT 44.1 45.0  MCV 83.7  --   PLT 231  --    Cardiac Enzymes: No results for input(s): CKTOTAL, CKMB, CKMBINDEX, TROPONINI in the last 168 hours. BNP: Invalid input(s): POCBNP CBG:  Recent Labs Lab 03/18/16 1131 03/18/16 1627 03/18/16 2242 03/19/16 0607 03/19/16 1156  GLUCAP 168* 146* 115* 113* 214*   D-Dimer No results for input(s): DDIMER in the last 72 hours. Hgb A1c  Recent Labs  03/17/16 1232  HGBA1C 9.3*   Lipid Profile No results for input(s): CHOL, HDL, LDLCALC, TRIG, CHOLHDL, LDLDIRECT in the last 72 hours. Thyroid function studies  Recent Labs  03/17/16 1232  TSH 3.292   Anemia work up No results for input(s): VITAMINB12, FOLATE, FERRITIN, TIBC, IRON,  RETICCTPCT in the last 72 hours. Urinalysis    Component Value Date/Time   COLORURINE YELLOW 03/16/2016 1650   APPEARANCEUR CLOUDY* 03/16/2016 1650   LABSPEC 1.034* 03/16/2016 1650   PHURINE 6.0 03/16/2016 1650   GLUCOSEU >1000* 03/16/2016 1650    HGBUR SMALL* 03/16/2016 1650   BILIRUBINUR NEGATIVE 03/16/2016 1650   KETONESUR 15* 03/16/2016 1650   PROTEINUR >300* 03/16/2016 1650   UROBILINOGEN 0.2 07/05/2010 2158   NITRITE POSITIVE* 03/16/2016 1650   LEUKOCYTESUR NEGATIVE 03/16/2016 1650   Sepsis Labs Invalid input(s): PROCALCITONIN,  WBC,  LACTICIDVEN    Time coordinating discharge: Over 30 minutes  SIGNED:  Vernell Leep, MD, FACP, FHM. [facility] Pager [phone]  If 7PM-7AM, please contact night-coverage [email or web address] Password [facility] 03/19/2016, 2:39 PM

## 2016-03-24 ENCOUNTER — Telehealth: Payer: Self-pay | Admitting: Cardiovascular Disease

## 2016-03-24 NOTE — Telephone Encounter (Signed)
Per  Alyse Low patient presented to office with indigestion but Dr Rex Kras concerned patient having afib EKG's received from Dr Rex Kras Reviewed by Dr Oval Linsey and patient not having Afib, sinus arrhythmia Scheduled follow up with Dr Sallyanne Kuster

## 2016-04-14 ENCOUNTER — Ambulatory Visit (INDEPENDENT_AMBULATORY_CARE_PROVIDER_SITE_OTHER): Payer: Medicare Other | Admitting: Cardiovascular Disease

## 2016-04-14 ENCOUNTER — Encounter (INDEPENDENT_AMBULATORY_CARE_PROVIDER_SITE_OTHER): Payer: Self-pay

## 2016-04-14 ENCOUNTER — Encounter: Payer: Self-pay | Admitting: Cardiovascular Disease

## 2016-04-14 VITALS — BP 131/67 | HR 60 | Ht 70.0 in | Wt 244.0 lb

## 2016-04-14 DIAGNOSIS — E1121 Type 2 diabetes mellitus with diabetic nephropathy: Secondary | ICD-10-CM | POA: Diagnosis not present

## 2016-04-14 DIAGNOSIS — G4733 Obstructive sleep apnea (adult) (pediatric): Secondary | ICD-10-CM

## 2016-04-14 DIAGNOSIS — I10 Essential (primary) hypertension: Secondary | ICD-10-CM

## 2016-04-14 DIAGNOSIS — I679 Cerebrovascular disease, unspecified: Secondary | ICD-10-CM

## 2016-04-14 DIAGNOSIS — IMO0002 Reserved for concepts with insufficient information to code with codable children: Secondary | ICD-10-CM

## 2016-04-14 DIAGNOSIS — I5032 Chronic diastolic (congestive) heart failure: Secondary | ICD-10-CM

## 2016-04-14 DIAGNOSIS — E1165 Type 2 diabetes mellitus with hyperglycemia: Secondary | ICD-10-CM

## 2016-04-14 NOTE — Progress Notes (Signed)
Cardiology Office Note    Date:  04/14/2016   ID:  Micheal Holt, DOB 10-07-1940, MRN [id number]  PCP:  Leonard Downing, MD  Cardiologist:   Sanda Klein, MD   chief complaint: Follow-up diastolic heart failure   History of Present Illness:  Micheal Holt is a 75 y.o. male with chronic diastolic heart failure, intracranial atherosclerotic disease, obstructive sleep apnea, hypertension, dyslipidemia and type 2 diabetes mellitus.  He was hospitalized with vertigo and severe nausea in early July. His symptoms have mostly resolved although occasionally has queasiness with rapid changes in position. He has not had any recent major cardiovascular health challenges. Glycemic control deteriorated last year but is starting to improve although is still far from optimal (hemoglobin A1c decreased from 10.6% to 8.8%).  He remains quite sedentary. For his level of activity he does not have problems with dyspnea, angina. He denies syncope, palpitations, leg edema, claudication. He remains noncompliant with CPAP. He is on treatment with clopidogrel following the TIA that occurred in 2004, without subsequent recurrent problems. He is known to have stenosis of a posterior cerebral artery. He had fairly profound symptoms of orthostatic hypotension was taking Hytrin, but the symptoms have improved on Flomax.  He has normal left ventricular systolic function, but has diastolic dysfunction and a mild to moderately dilated left atrium by echocardiography. He had a normal nuclear stress test in 2009  Past Medical History:  Diagnosis Date  . Cerebrovascular disease    by MRA in Feb 2004 with moderate posterior cerebral artery stenosis  . Chest pain    most likely Gastroesophageal reflux  . Chronic diastolic CHF (congestive heart failure), NYHA class 1 ([place])    currently euvolemic  . Chronic kidney disease (CKD), stage II (mild)   . DM (diabetes mellitus) ([place])    type 2  . Dyslipidemia 2009  .  Dyspnea   . Hyperlipidemia   . Obesity    moderate to severe  . Obstructive sleep apnea    he refused to use cpap  . Systemic hypertension   . TIA (transient ischemic attack)    in Feb of 2004    Past Surgical History:  Procedure Laterality Date  . CARDIOVASCULAR STRESS TEST     within normal limits EF of 54%  . DOPPLER ECHOCARDIOGRAPHY     renal artery and they were within normal limits    Current Medications: Outpatient Medications Prior to Visit  Medication Sig Dispense Refill  . amLODipine (NORVASC) 5 MG tablet TAKE 1 TABLET BY MOUTH DAILY 30 tablet 11  . aspirin 81 MG tablet Take 81 mg by mouth daily.    Marland Kitchen atorvastatin (LIPITOR) 20 MG tablet TAKE 1 TABLET BY MOUTH ONCE DAILY 30 tablet 11  . benazepril (LOTENSIN) 40 MG tablet Take 1 tablet (40 mg total) by mouth daily. for high blood pressure 30 tablet 7  . Bromfenac Sodium (PROLENSA) 0.07 % SOLN Place 1 drop into the left eye daily.    . clopidogrel (PLAVIX) 75 MG tablet TAKE 1 TABLET BY MOUTH DAILY 30 tablet 11  . fesoterodine (TOVIAZ) 8 MG TB24 tablet Take 8 mg by mouth daily.    . furosemide (LASIX) 40 MG tablet TAKE 1 TABLET BY MOUTH DAILY. MAY INCREASE TO 2 TABLETS DAILY IF WEIGHT INCREASES TO MORE THAN 2 POUNDS FROM BASELINE 45 tablet 11  . glimepiride (AMARYL) 4 MG tablet Take 4 mg by mouth daily.     . meclizine (ANTIVERT) 25 MG tablet Take  1 tablet (25 mg total) by mouth 3 (three) times daily as needed for dizziness. 30 tablet 0  . metFORMIN (GLUCOPHAGE) 1000 MG tablet Take 1,000 mg by mouth daily.     . pantoprazole (PROTONIX) 40 MG tablet TAKE 1 TABLET BY MOUTH DAILY 30 tablet 9  . potassium chloride SA (KLOR-CON M20) 20 MEQ tablet Take 2 tablets (40 mEq total) by mouth daily. 30 tablet 0  . solifenacin (VESICARE) 10 MG tablet Take 10 mg by mouth daily.    . tamsulosin (FLOMAX) 0.4 MG CAPS capsule TAKE 1 CAPSULE BY MOUTH EVERY NIGHT AT BEDTIME 30 capsule 6   No facility-administered medications prior to visit.        Allergies:   Review of patient's allergies indicates no known allergies.   Social History   Social History  . Marital status: Married    Spouse name: N/A  . Number of children: 2  . Years of education: masters    Social History Main Topics  . Smoking status: Former Research scientist (life sciences)  . Smokeless tobacco: Never Used  . Alcohol use No  . Drug use: No  . Sexual activity: Not Asked   Other Topics Concern  . None   Social History Narrative   Married,    Caffeine use- none     Family History:  The patient's family history includes Arthritis in his maternal grandmother; Cancer in his sister; Dementia in his paternal grandfather; Diabetes type I in his paternal grandmother; Diabetes type II in his father; Heart failure in his father, maternal grandfather, and paternal grandfather; Stroke in his mother.   ROS:   Please see the history of present illness.    ROS All other systems reviewed and are negative.   PHYSICAL EXAM:   VS:  BP 131/67   Pulse 60   Ht 5\' 10"  (1.778 m)   Wt 244 lb (110.7 kg)   BMI 35.01 kg/m    GEN: Well nourished, well developed, in no acute distress  HEENT: normal  Neck: no JVD, carotid bruits, or masses Cardiac: RRR; no murmurs, rubs, or gallops,no edema  Respiratory:  clear to auscultation bilaterally, normal work of breathing GI: soft, nontender, nondistended, + BS MS: no deformity or atrophy  Skin: warm and dry, no rash Neuro:  Alert and Oriented x 3, Strength and sensation are intact Psych: euthymic mood, full affect  Wt Readings from Last 3 Encounters:  04/14/16 244 lb (110.7 kg)  03/17/16 236 lb 1.6 oz (107.1 kg)  03/16/16 255 lb (115.7 kg)      Studies/Labs Reviewed:   EKG:  EKG is not ordered today.  The ekg ordered today demonstrates  19 is of poor quality tracing, but shows sinus rhythm with mild first-degree AV block and with PACs, prolonged QTc 497 ms and is otherwise normal. He had mild hypokalemia at the time  Recent  Labs: 03/16/2016: ALT 15; Hemoglobin 15.3; Platelets 231 03/17/2016: Magnesium 1.8; TSH 3.292 03/19/2016: BUN 11; Creatinine, Ser 1.23; Potassium 3.3; Sodium 137   Lipid Panel No results found for: CHOL, TRIG, HDL, CHOLHDL, VLDL, LDLCALC, LDLDIRECT  Additional studies/ records that were reviewed today include:  Records from recent hospitalization    ASSESSMENT:    1. Chronic diastolic CHF (congestive heart failure), NYHA class 1 ([place])   2. Essential hypertension   3. Uncontrolled type 2 diabetes mellitus with diabetic nephropathy, without long-term current use of insulin ([place])   4. OSA (obstructive sleep apnea)   5. Intracranial vascular stenosis  PLAN:  In order of problems listed above:  1. CHF: Appears clinically at euvolemic state, hard to assess functional status due to sedentary lifestyle, but at worst NYHA class II 2. HTN: Adequately treated 3. DM: Control is poor but is improving. No lipid profile is available on the labs faxed over from his primary care provider. We'll try to get his most recent lipid profile. 4. OSA not compliant with CPAP 5. ASCVD with remote history of TIA, on chronic clopidogrel    Medication Adjustments/Labs and Tests Ordered: Current medicines are reviewed at length with the patient today.  Concerns regarding medicines are outlined above.  Medication changes, Labs and Tests ordered today are listed in the Patient Instructions below. Patient Instructions  Dr Sallyanne Kuster recommends that you schedule a follow-up appointment in 12 months. You will receive a reminder letter in the mail two months in advance. If you don't receive a letter, please call our office to schedule the follow-up appointment.  If you need a refill on your cardiac medications before your next appointment, please call your pharmacy.    Signed, Sanda Klein, MD  04/14/2016 7:45 PM    [address], [place], [place]  [address] Phone: [phone]; Fax: [phone]

## 2016-04-14 NOTE — Patient Instructions (Signed)
Dr Croitoru recommends that you schedule a follow-up appointment in 12 months. You will receive a reminder letter in the mail two months in advance. If you don't receive a letter, please call our office to schedule the follow-up appointment.  If you need a refill on your cardiac medications before your next appointment, please call your pharmacy. 

## 2016-04-21 ENCOUNTER — Encounter: Payer: Self-pay | Admitting: Cardiovascular Disease

## 2016-06-10 ENCOUNTER — Encounter: Payer: Self-pay | Admitting: Internal Medicine

## 2016-07-08 ENCOUNTER — Other Ambulatory Visit: Payer: Self-pay | Admitting: Cardiovascular Disease

## 2016-08-03 ENCOUNTER — Other Ambulatory Visit: Payer: Self-pay | Admitting: Cardiovascular Disease

## 2016-08-16 ENCOUNTER — Encounter: Payer: Self-pay | Admitting: Cardiovascular Disease

## 2016-08-16 ENCOUNTER — Ambulatory Visit (INDEPENDENT_AMBULATORY_CARE_PROVIDER_SITE_OTHER): Payer: Medicare Other | Admitting: Internal Medicine

## 2016-08-16 ENCOUNTER — Encounter: Payer: Self-pay | Admitting: Internal Medicine

## 2016-08-16 ENCOUNTER — Telehealth: Payer: Self-pay

## 2016-08-16 VITALS — BP 140/76 | HR 80 | Ht 69.0 in | Wt 245.5 lb

## 2016-08-16 DIAGNOSIS — R194 Change in bowel habit: Secondary | ICD-10-CM

## 2016-08-16 DIAGNOSIS — R152 Fecal urgency: Secondary | ICD-10-CM

## 2016-08-16 DIAGNOSIS — R159 Full incontinence of feces: Secondary | ICD-10-CM | POA: Diagnosis not present

## 2016-08-16 NOTE — Patient Instructions (Signed)
   You have been scheduled for a colonoscopy. Please follow written instructions given to you at your visit today.  Please pick up your prep supplies at the pharmacy. If you use inhalers (even only as needed), please bring them with you on the day of your procedure.    You will be contaced by our office prior to your procedure for directions on holding your Plavix.  If you do not hear from our office 1 week prior to your scheduled procedure, please call [phone] to discuss.    I appreciate the opportunity to care for you. Silvano Rusk, MD, [facility]

## 2016-08-16 NOTE — Telephone Encounter (Signed)
[name] takes clopidogrel due to a history of transient ischemic attack, not due to coronary artery disease. From my point of view, it is safe for him to discontinue clopidogrel for 5 days before the planned colonoscopy and resume it as soon as safe after the procedure. I'm not sure if he is actively seeing a neurologist at this time, to give you their point of view.  Sanda Klein, MD, [facility] [phone] office [phone] pager

## 2016-08-16 NOTE — Telephone Encounter (Signed)
   RE: Micheal Holt DOB: [birth year] MRN: [id number]   Dear Dr Sanda Klein,    We have scheduled the above patient for an endoscopic procedure. Our records show that he is on anticoagulation therapy.   Please advise as to how long the patient may come off his therapy of Plavix prior to the colonoscopy procedure, which is scheduled for 10/08/16.  Please fax back/ or route the completed form to Dmitry Macomber [place], [place] at [phone].   Sincerely,   Silvano Rusk, MD, [facility]
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
## 2016-08-17 NOTE — Telephone Encounter (Signed)
Please review the message from Dr Leander Rams, thank you.

## 2016-08-17 NOTE — Telephone Encounter (Signed)
Left message to call me back.

## 2016-08-17 NOTE — Telephone Encounter (Signed)
OK to hold clopidogrel in my opinion too

## 2016-08-19 NOTE — Telephone Encounter (Signed)
Left message on his home # to call me back.

## 2016-08-20 NOTE — Telephone Encounter (Signed)
Patient informed to hold plavix 5 days and verbalized understanding.

## 2016-08-26 ENCOUNTER — Other Ambulatory Visit: Payer: Self-pay | Admitting: Cardiovascular Disease

## 2016-09-03 ENCOUNTER — Other Ambulatory Visit: Payer: Self-pay | Admitting: Cardiovascular Disease

## 2016-09-03 NOTE — Telephone Encounter (Signed)
Rx has been sent to the pharmacy electronically. ° °

## 2016-09-13 ENCOUNTER — Other Ambulatory Visit: Payer: Self-pay | Admitting: Cardiovascular Disease

## 2016-09-14 NOTE — Telephone Encounter (Signed)
Rx(s) sent to pharmacy electronically.  

## 2016-09-16 ENCOUNTER — Other Ambulatory Visit: Payer: Self-pay | Admitting: Cardiovascular Disease

## 2016-09-18 ENCOUNTER — Other Ambulatory Visit: Payer: Self-pay | Admitting: Cardiovascular Disease

## 2016-10-08 ENCOUNTER — Ambulatory Visit (AMBULATORY_SURGERY_CENTER): Payer: Medicare Other | Admitting: Internal Medicine

## 2016-10-08 ENCOUNTER — Encounter: Payer: Self-pay | Admitting: Internal Medicine

## 2016-10-08 VITALS — BP 161/92 | HR 70 | Temp 98.6°F | Resp 17 | Ht 69.0 in | Wt 245.0 lb

## 2016-10-08 DIAGNOSIS — R194 Change in bowel habit: Secondary | ICD-10-CM | POA: Diagnosis not present

## 2016-10-08 DIAGNOSIS — D122 Benign neoplasm of ascending colon: Secondary | ICD-10-CM | POA: Diagnosis not present

## 2016-10-08 DIAGNOSIS — D123 Benign neoplasm of transverse colon: Secondary | ICD-10-CM | POA: Diagnosis not present

## 2016-10-08 LAB — GLUCOSE, CAPILLARY
GLUCOSE-CAPILLARY: 228 mg/dL — AB (ref 65–99)
Glucose-Capillary: 197 mg/dL — ABNORMAL HIGH (ref 65–99)

## 2016-10-08 MED ORDER — SODIUM CHLORIDE 0.9 % IV SOLN: 500.0000 mL | INTRAVENOUS | Status: DC

## 2016-10-08 NOTE — Patient Instructions (Addendum)
I found and removed 7 small polyps. No cause of your symptoms identified.  Restart your clopidogrel (Plavix) today.  I think you need a test called an ano-rectal manometry - it evaluates the function of the anus and rectum and can help [place] understand why you are having the leakage of stool. My staff will contact you about scheduling this.  I appreciate the opportunity to care for you. Gatha Mayer, MD, [facility]  Impression/Recommendations:  Polyp handout given to patient.  Resume Plavix (clopidogrel) at prior dose today.  Repeat colonoscopy recommended.  Date to be determined after pathology results are reviewed.  YOU HAD AN ENDOSCOPIC PROCEDURE TODAY AT [facility]:   Refer to the procedure report that was given to you for any specific questions about what was found during the examination.  If the procedure report does not answer your questions, please call your gastroenterologist to clarify.  If you requested that your care partner not be given the details of your procedure findings, then the procedure report has been included in a sealed envelope for you to review at your convenience later.  YOU SHOULD EXPECT: Some feelings of bloating in the abdomen. Passage of more gas than usual.  Walking can help get rid of the air that was put into your GI tract during the procedure and reduce the bloating. If you had a lower endoscopy (such as a colonoscopy or flexible sigmoidoscopy) you may notice spotting of blood in your stool or on the toilet paper. If you underwent a bowel prep for your procedure, you may not have a normal bowel movement for a few days.  Please Note:  You might notice some irritation and congestion in your nose or some drainage.  This is from the oxygen used during your procedure.  There is no need for concern and it should clear up in a day or so.  SYMPTOMS TO REPORT IMMEDIATELY:   Following lower endoscopy (colonoscopy or flexible  sigmoidoscopy):  Excessive amounts of blood in the stool  Significant tenderness or worsening of abdominal pains  Swelling of the abdomen that is new, acute  Fever of 100F or higher For urgent or emergent issues, a gastroenterologist can be reached at any hour by calling [phone].   DIET:  We do recommend a small meal at first, but then you may proceed to your regular diet.  Drink plenty of fluids but you should avoid alcoholic beverages for 24 hours.  ACTIVITY:  You should plan to take it easy for the rest of today and you should NOT DRIVE or use heavy machinery until tomorrow (because of the sedation medicines used during the test).    FOLLOW UP: Our staff will call the number listed on your records the next business day following your procedure to check on you and address any questions or concerns that you may have regarding the information given to you following your procedure. If we do not reach you, we will leave a message.  However, if you are feeling well and you are not experiencing any problems, there is no need to return our call.  We will assume that you have returned to your regular daily activities without incident.  If any biopsies were taken you will be contacted by phone or by letter within the next 1-3 weeks.  Please call us at [phone] if you have not heard about the biopsies in 3 weeks.    SIGNATURES/CONFIDENTIALITY: You and/or your care partner have signed  paperwork which will be entered into your electronic medical record.  These signatures attest to the fact that that the information above on your After Visit Summary has been reviewed and is understood.  Full responsibility of the confidentiality of this discharge information lies with you and/or your care-partner.

## 2016-10-08 NOTE — Progress Notes (Signed)
Pt's states no medical or surgical changes since previsit or office visit. 

## 2016-10-08 NOTE — Op Note (Signed)
[place] Patient Name: Micheal Holt Procedure Date: 10/08/2016 1:48 PM MRN: [id number] Endoscopist: Gatha Mayer , MD Age: 76 Referring MD:  Date of Birth: Dec 08, 1940 Gender: Male Account #: [id number] Procedure:                Colonoscopy Indications:              Personal history of colonic polyps, Change in bowel                            habits, Fecal incontinence Medicines:                Propofol per Anesthesia, Monitored Anesthesia Care Procedure:                Pre-Anesthesia Assessment:                           - Prior to the procedure, a History and Physical                            was performed, and patient medications and                            allergies were reviewed. The patient's tolerance of                            previous anesthesia was also reviewed. The risks                            and benefits of the procedure and the sedation                            options and risks were discussed with the patient.                            All questions were answered, and informed consent                            was obtained. Prior Anticoagulants: The patient                            last took Plavix (clopidogrel) 5 days prior to the                            procedure. ASA Grade Assessment: II - A patient                            with mild systemic disease. After reviewing the                            risks and benefits, the patient was deemed in                            satisfactory condition to undergo the procedure.  After obtaining informed consent, the colonoscope                            was passed under direct vision. Throughout the                            procedure, the patient's blood pressure, pulse, and                            oxygen saturations were monitored continuously. The                            Model CF-HQ190L [id number]) scope was introduced                            through  the anus and advanced to the the terminal                            ileum, with identification of the appendiceal                            orifice and IC valve. The colonoscopy was performed                            without difficulty. The patient tolerated the                            procedure well. The quality of the bowel                            preparation was good. The bowel preparation used                            was Miralax. The terminal ileum, ileocecal valve,                            appendiceal orifice, and rectum were photographed. Scope In: 1:52:43 PM Scope Out: 2:12:48 PM Scope Withdrawal Time: 0 hours 16 minutes 32 seconds  Total Procedure Duration: 0 hours 20 minutes 5 seconds  Findings:                 The perianal and digital rectal examinations were                            normal. Pertinent negatives include normal prostate                            (size, shape, and consistency).                           Two sessile polyps were found in the ascending                            colon. The polyps were 2 to 3 mm in size.  These                            polyps were removed with a cold biopsy forceps.                            Resection and retrieval were complete. Verification                            of patient identification for the specimen was                            done. Estimated blood loss was minimal.                           Five sessile polyps were found in the transverse                            colon. The polyps were 3 to 5 mm in size. These                            polyps were removed with a cold snare. Resection                            and retrieval were complete. Verification of                            patient identification for the specimen was done.                            Estimated blood loss was minimal.                           The terminal ileum appeared normal.                           A few small and  large-mouthed diverticula were                            found in the sigmoid colon.                           The exam was otherwise without abnormality on                            direct and retroflexion views. Complications:            No immediate complications. Estimated Blood Loss:     Estimated blood loss was minimal. Impression:               - Two 2 to 3 mm polyps in the ascending colon,                            removed with a cold biopsy forceps. Resected and  retrieved.                           - Five 3 to 5 mm polyps in the transverse colon,                            removed with a cold snare. Resected and retrieved.                           - The examined portion of the ileum was normal.                           - Personal history of colonic polyps. Recommendation:           - Patient has a contact number available for                            emergencies. The signs and symptoms of potential                            delayed complications were discussed with the                            patient. Return to normal activities tomorrow.                            Written discharge instructions were provided to the                            patient.                           - Resume previous diet.                           - Continue present medications.                           - Resume Plavix (clopidogrel) at prior dose today.                           - Repeat colonoscopy is recommended. The                            colonoscopy date will be determined after pathology                            results from today's exam become available for                            review.                           - My office will arrange an anorectal manometry to  evaluate fecal incontinence Gatha Mayer, MD 10/08/2016 2:23:17 PM This report has been signed electronically.

## 2016-10-08 NOTE — Progress Notes (Signed)
Report to PACU, RN, vss, BBS= Clear.  

## 2016-10-08 NOTE — Progress Notes (Signed)
Called to room to assist during endoscopic procedure.  Patient ID and intended procedure confirmed with present staff. Received instructions for my participation in the procedure from the performing physician.  

## 2016-10-11 ENCOUNTER — Telehealth: Payer: Self-pay | Admitting: *Deleted

## 2016-10-11 NOTE — Telephone Encounter (Signed)
  Follow up Call-  Call back number 10/08/2016  Post procedure Call Back phone  # [phone]  Permission to leave phone message Yes  Some recent data might be hidden     Patient questions:  Do you have a fever, pain , or abdominal swelling? No. Pain Score  0 *  Have you tolerated food without any problems? Yes.    Have you been able to return to your normal activities? Yes.    Do you have any questions about your discharge instructions: Diet   No. Medications  No. Follow up visit  No.  Do you have questions or concerns about your Care? No.  Actions: * If pain score is 4 or above: No action needed, pain <4.

## 2016-10-14 ENCOUNTER — Encounter: Payer: Self-pay | Admitting: Internal Medicine

## 2016-10-14 DIAGNOSIS — Z86010 Personal history of colonic polyps: Secondary | ICD-10-CM

## 2016-10-14 DIAGNOSIS — Z860101 Personal history of adenomatous and serrated colon polyps: Secondary | ICD-10-CM

## 2016-10-14 HISTORY — DX: Personal history of colonic polyps: Z86.010

## 2016-10-14 HISTORY — DX: Personal history of adenomatous and serrated colon polyps: Z86.0101

## 2016-10-14 NOTE — Progress Notes (Signed)
7 diminutive adenomas ? Recall in 3-5 years - age likely precludes

## 2016-10-19 ENCOUNTER — Telehealth: Payer: Self-pay

## 2016-10-19 NOTE — Telephone Encounter (Signed)
Request for surgical clearance:   1. What type surgery is being performed? Dental surgery  2. When is this surgery scheduled? pending  3. Are there any medications that need to be held prior to surgery and how long? ASA 81 mg QD, Plaivx 75 mg QD for 7 days prior to date of proposed dental surgery  4. Name of the physician performing surgery: Dr Alpha Gula  5. What is the office phone and fax number?   Phone [phone]  Fax [phone]

## 2016-10-20 ENCOUNTER — Encounter: Payer: Self-pay | Admitting: Cardiovascular Disease

## 2016-10-20 NOTE — Telephone Encounter (Signed)
Dr. Radford Pax not in [place]. Addressed to PCP, in epic. Please fax

## 2016-10-21 NOTE — Telephone Encounter (Signed)
Follow up      Dr. Radford Pax office calling regarding previous messages that was sent .

## 2016-10-21 NOTE — Telephone Encounter (Signed)
Letter sent to provided fax number.

## 2016-11-12 ENCOUNTER — Ambulatory Visit (HOSPITAL_COMMUNITY)
Admission: RE | Admit: 2016-11-12 | Discharge: 2016-11-12 | Disposition: A | Discharge status: Home / Self Care | Payer: Medicare Other | Source: Ambulatory Visit | Attending: Internal Medicine | Admitting: Internal Medicine

## 2016-11-12 ENCOUNTER — Encounter (HOSPITAL_COMMUNITY)
Admission: RE | Disposition: A | Discharge status: Home / Self Care | Payer: Self-pay | Source: Ambulatory Visit | Attending: Internal Medicine

## 2016-11-12 DIAGNOSIS — R159 Full incontinence of feces: Secondary | ICD-10-CM

## 2016-11-12 HISTORY — PX: ANAL RECTAL MANOMETRY: SHX6358

## 2016-11-12 SURGERY — MANOMETRY, ANORECTAL

## 2016-11-12 NOTE — Progress Notes (Signed)
Anal Manometry done per protocol. Patient tolerated well without complication.  Report to be sent to Dr. Celesta Aver office.

## 2016-11-14 ENCOUNTER — Encounter (HOSPITAL_COMMUNITY): Payer: Self-pay | Admitting: Internal Medicine

## 2016-11-17 ENCOUNTER — Encounter: Payer: Self-pay | Admitting: Internal Medicine

## 2016-11-17 DIAGNOSIS — R159 Full incontinence of feces: Secondary | ICD-10-CM

## 2016-11-17 DIAGNOSIS — M6289 Other specified disorders of muscle: Secondary | ICD-10-CM

## 2016-11-17 HISTORY — DX: Other specified disorders of muscle: M62.89

## 2016-11-17 NOTE — Op Note (Signed)
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

## 2016-11-17 NOTE — Progress Notes (Signed)
Please call patient - anorectal manometry shows weak sphincter and pelvic floor I recommend he try pelvic floor PT to see if that helps with is incontinence Let me know if he has ?

## 2016-11-18 ENCOUNTER — Telehealth: Payer: Self-pay

## 2016-11-18 DIAGNOSIS — R159 Full incontinence of feces: Secondary | ICD-10-CM

## 2016-11-18 NOTE — Telephone Encounter (Signed)
-----   Message from Gatha Mayer, MD sent at 11/17/2016  7:15 PM EDT ----- Regarding: appt This could be repeat but needs pelvic floor PT due to fecal incontinence - pelvic floor weakness

## 2016-11-18 NOTE — Telephone Encounter (Signed)
Left message for patient to call back  

## 2016-11-22 NOTE — Telephone Encounter (Signed)
Left message for patient to call back  

## 2016-11-24 NOTE — Telephone Encounter (Signed)
Patient's wife notified of the results and recommendations. She understands that physical therapy will contact her directly with an appt.

## 2016-12-09 ENCOUNTER — Encounter: Payer: Self-pay | Admitting: Physical Therapy

## 2016-12-09 ENCOUNTER — Ambulatory Visit: Payer: Medicare Other | Attending: Internal Medicine | Admitting: Physical Therapy

## 2016-12-09 DIAGNOSIS — M6281 Muscle weakness (generalized): Secondary | ICD-10-CM | POA: Diagnosis not present

## 2016-12-09 DIAGNOSIS — M62838 Other muscle spasm: Secondary | ICD-10-CM | POA: Diagnosis present

## 2016-12-09 DIAGNOSIS — R279 Unspecified lack of coordination: Secondary | ICD-10-CM | POA: Diagnosis present

## 2016-12-09 NOTE — Patient Instructions (Signed)
Relaxation Exercises with the Urge to Void   When you experience an urge to void:  FIRST  Stop and stand very still    Sit down if you can    Don't move    You need to stay very still to maintain control  SECOND Squeeze your pelvic floor muscles 5 times, like a quick flick, to keep from leaking  THIRD Relax  Take a deep breath and then let it out  Try to make the urge go away by using relaxation and visualization techniques  FINALLY When you feel the urge go away somewhat, walk normally to the bathroom.   If the urge gets suddenly stronger on the way, you may stop again and relax to regain control.  [facility] [address], [place], [place] [address] Phone # [phone] Fax [phone]
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
## 2016-12-12 NOTE — Addendum Note (Signed)
Addended by: Lovett Calender D on: 12/12/2016 03:37 PM   Modules accepted: Orders

## 2016-12-15 ENCOUNTER — Encounter: Payer: Self-pay | Admitting: Physical Therapy

## 2016-12-15 ENCOUNTER — Ambulatory Visit: Payer: Medicare Other | Admitting: Physical Therapy

## 2016-12-15 DIAGNOSIS — M6281 Muscle weakness (generalized): Secondary | ICD-10-CM | POA: Diagnosis not present

## 2016-12-15 DIAGNOSIS — M62838 Other muscle spasm: Secondary | ICD-10-CM

## 2016-12-15 DIAGNOSIS — R279 Unspecified lack of coordination: Secondary | ICD-10-CM
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
## 2016-12-15 NOTE — Patient Instructions (Signed)
Balloon Breath    Place hands LIGHTLY on belly below navel. Imagine a balloon inside belly. Blow up balloon on breath IN, deflate balloon on breath OUT. Feel the ribcage expand with inhale and collapse with the exhale.  Contract abdominals slightly to assist breath OUT. Time _2-3__ minutes/day  Copyright  VHI. All rights reserved.

## 2016-12-20 ENCOUNTER — Ambulatory Visit: Payer: Medicare Other | Admitting: Physical Therapy

## 2016-12-20 DIAGNOSIS — M6281 Muscle weakness (generalized): Secondary | ICD-10-CM | POA: Diagnosis not present

## 2016-12-20 DIAGNOSIS — M62838 Other muscle spasm: Secondary | ICD-10-CM

## 2016-12-20 DIAGNOSIS — R279 Unspecified lack of coordination: Secondary | ICD-10-CM
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
## 2016-12-20 NOTE — Patient Instructions (Addendum)
  Take any brand of this fiber starting with about 1tsp/day, then increasing gradually up to 1Tbsp as tolerated    Squeeze a ball or pillow between your knees while lying on your back Contract pelvic floor as you squeeze and breath out (do not hold your breathe)   Hold for 3 sec, Rest for 3 sec and repeat 20x

## 2016-12-22 ENCOUNTER — Encounter: Payer: Self-pay | Admitting: Physical Therapy

## 2016-12-22 ENCOUNTER — Ambulatory Visit: Payer: Medicare Other | Admitting: Physical Therapy

## 2016-12-22 DIAGNOSIS — R279 Unspecified lack of coordination: Secondary | ICD-10-CM

## 2016-12-22 DIAGNOSIS — M6281 Muscle weakness (generalized): Secondary | ICD-10-CM | POA: Diagnosis not present

## 2016-12-22 DIAGNOSIS — M62838 Other muscle spasm: Secondary | ICD-10-CM

## 2016-12-22 NOTE — Patient Instructions (Addendum)
  Bracing With Bridging (Hook-Lying)    With neutral spine, tighten pelvic floor and abdominals and hold. Lift bottom. Repeat _20__ times. Do __1_ times a day.  Do one set with ball squeeze and one set with red band around knees   Copyright  VHI. All rights reserved.      Band around the knees, bring one knee out to the side 20x, repeat on other side   Ball squeeze in sitting: squeeze pelvic floor muscles and squeeze ball at the same time, hold for 2sec repeat 15x  Sitting on toilet and see how long you can hold contraction and not have BM - try to hold 30 seconds and build up to 1 minute
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
## 2016-12-27 ENCOUNTER — Ambulatory Visit: Payer: Medicare Other | Admitting: Physical Therapy

## 2016-12-27 DIAGNOSIS — M62838 Other muscle spasm: Secondary | ICD-10-CM

## 2016-12-27 DIAGNOSIS — R279 Unspecified lack of coordination: Secondary | ICD-10-CM

## 2016-12-27 DIAGNOSIS — M6281 Muscle weakness (generalized): Secondary | ICD-10-CM

## 2016-12-27 NOTE — Patient Instructions (Signed)
Knee-to-Chest Stretch: Unilateral    With hand behind right knee, pull knee in to chest until a comfortable stretch is felt in lower back and buttocks. Keep back relaxed. Hold __10__ seconds. Repeat _5___ times per set. Do _1___ sets per session. Do __1__ sessions per day.  http://orth.exer.us/126   Copyright  VHI. All rights reserved.   Supine With Rotation    Lie, back flat, legs bent, feet together. Rotate knees to one side. Hold _5__ seconds. Repeat to other side. Repeat __10_ times per session. Do __1(Home) Flexion: Pelvic Tilt    Lie with neck supported, knees bent, feet flat. Tighten and suck stomach in, pushing back down against surface. Do not push down with legs. Repeat ____ times per set. Do ____ sets per session. Do ____ sessions per week.  Copyright  VHI. All rights reserved.  _ sessions per day.  Copyright  VHI. All rights reserved.
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
## 2016-12-29 ENCOUNTER — Ambulatory Visit: Payer: Medicare Other | Admitting: Physical Therapy

## 2016-12-29 ENCOUNTER — Encounter: Payer: Self-pay | Admitting: Physical Therapy

## 2016-12-29 DIAGNOSIS — M6281 Muscle weakness (generalized): Secondary | ICD-10-CM

## 2016-12-29 DIAGNOSIS — M62838 Other muscle spasm: Secondary | ICD-10-CM

## 2016-12-29 DIAGNOSIS — R279 Unspecified lack of coordination: Secondary | ICD-10-CM
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
## 2016-12-29 NOTE — Patient Instructions (Signed)
  Holding a ball or pillow between knees, sitting up tall, engage core and pelvic floor as you squeeze the ball Squeeze and hold 2 seconds repeat 10x and do 3 x/day     Sitting with straight posture not leaning back into the chair, engage abdominals and alternate arms lifting up one at a time Repeat 10x each arm    Curve your spine and reach arm over to the opposite wall Reach 10x to each side to get a stretch on the side     Standing with one foot behind the other, push pelvis forward and feel stretch in the front of the back leg Hold 30sec repeat 3 x throughout the day

## 2017-01-03 ENCOUNTER — Ambulatory Visit: Payer: Medicare Other | Admitting: Physical Therapy

## 2017-01-03 DIAGNOSIS — M6281 Muscle weakness (generalized): Secondary | ICD-10-CM

## 2017-01-03 DIAGNOSIS — R279 Unspecified lack of coordination: Secondary | ICD-10-CM

## 2017-01-03 DIAGNOSIS — M62838 Other muscle spasm: Secondary | ICD-10-CM
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
## 2017-01-03 NOTE — Patient Instructions (Signed)
Balloon Breath    Place hands LIGHTLY on belly below navel. Imagine a balloon inside belly. Blow up balloon on breath IN, deflate balloon on breath OUT. Contract abdominals slightly to assist breath OUT. Time _5__ minutes.  Copyright  VHI. All rights reserved.   [facility] [address], [place], [place] [address] Phone # [phone] Fax [phone]

## 2017-01-05 ENCOUNTER — Ambulatory Visit: Payer: Medicare Other | Attending: Internal Medicine | Admitting: Physical Therapy

## 2017-01-05 DIAGNOSIS — M62838 Other muscle spasm: Secondary | ICD-10-CM | POA: Insufficient documentation

## 2017-01-05 DIAGNOSIS — R279 Unspecified lack of coordination: Secondary | ICD-10-CM | POA: Diagnosis present

## 2017-01-05 DIAGNOSIS — M6281 Muscle weakness (generalized): Secondary | ICD-10-CM | POA: Insufficient documentation
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
## 2017-01-10 ENCOUNTER — Ambulatory Visit: Payer: Medicare Other | Admitting: Physical Therapy

## 2017-01-10 DIAGNOSIS — M6281 Muscle weakness (generalized): Secondary | ICD-10-CM

## 2017-01-10 DIAGNOSIS — R279 Unspecified lack of coordination: Secondary | ICD-10-CM

## 2017-01-10 DIAGNOSIS — M62838 Other muscle spasm: Secondary | ICD-10-CM

## 2017-01-10 NOTE — Patient Instructions (Addendum)
Bowel Control and Holding On When bowel control is decreased or lost it is helpful to understand some control techniques. Here are some tips for controlling your bowel movements.  1. Choose the best time of day to have a bowel movement:  Usually the best time of day for a bowel movement will be a half hour to an hour after breakfast.  For some people, a half hour to an hour after lunch will work better.  These times are best because the body uses the gastrocolic reflex, a stimulation of bowel motion that occurs with eating, to help produce a bowel movement.  2. Make sure that you are not rushed and have convenient access to a bathroom at your selected time.  3. Eat all your meals at a predictable time each day.  The bowel functions best when food is introduced at the same regular intervals.  4. The amount of food eaten at a given time of day should be about the same size from day to day.  The bowel functions best when food is introduced in similar quantities from day to day.  It is fine to have a small breakfast and a large lunch, or vice versa, just be consistent.  5. Eat two servings of fruit or vegetables and at least one serving of complex carbohydrates (whole grains such as brown rice, bran, whole wheat bread, or oatmeal) at each meal.   A serving of fruit or vegetables is a half-cup or medium-sized piece of fruit.  A serving of a complex carbohydrate is a half-cup or a slice of bread.  It is often desirable to eat more than the recommended minimum amounts of fruits, vegetables, and complex carbohydrates.  6. Drink plenty of water-ideally eight glasses a day.  7. Until regular bowel movements are established at a desired time of day, take 2-3 dried prunes (or  to 1/3 cup of prune juice) each night to stimulate morning bowel function.  8. Exercise daily.  You may exercise at any time of day, but you may find that bowel function is helped most if the exercise is at a consistent time each  day.    Sit to Stand (Sitting)    Sit on chair or ball. Tighten pelvic floor and hold. Lean forward. Stand up. Repeat _10__ times. Do __3_ times a day.  Copyright  VHI. All rights reserved.
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
## 2017-01-12 ENCOUNTER — Ambulatory Visit: Payer: Medicare Other | Admitting: Physical Therapy

## 2017-01-12 DIAGNOSIS — M6281 Muscle weakness (generalized): Secondary | ICD-10-CM

## 2017-01-12 DIAGNOSIS — R279 Unspecified lack of coordination: Secondary | ICD-10-CM

## 2017-01-12 DIAGNOSIS — M62838 Other muscle spasm: Secondary | ICD-10-CM
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
## 2017-01-17 ENCOUNTER — Encounter: Payer: Self-pay | Admitting: Physical Therapy

## 2017-01-17 ENCOUNTER — Ambulatory Visit: Payer: Medicare Other | Admitting: Physical Therapy

## 2017-01-17 DIAGNOSIS — M6281 Muscle weakness (generalized): Secondary | ICD-10-CM

## 2017-01-17 DIAGNOSIS — M62838 Other muscle spasm: Secondary | ICD-10-CM

## 2017-01-17 DIAGNOSIS — R279 Unspecified lack of coordination: Secondary | ICD-10-CM
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
## 2017-01-19 ENCOUNTER — Ambulatory Visit: Payer: Medicare Other | Admitting: Physical Therapy

## 2017-01-19 DIAGNOSIS — R279 Unspecified lack of coordination: Secondary | ICD-10-CM

## 2017-01-19 DIAGNOSIS — M62838 Other muscle spasm: Secondary | ICD-10-CM

## 2017-01-19 DIAGNOSIS — M6281 Muscle weakness (generalized): Secondary | ICD-10-CM
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
## 2017-01-24 ENCOUNTER — Encounter: Payer: Self-pay | Admitting: Physical Therapy

## 2017-01-24 ENCOUNTER — Ambulatory Visit: Payer: Medicare Other | Admitting: Physical Therapy

## 2017-01-24 DIAGNOSIS — M6281 Muscle weakness (generalized): Secondary | ICD-10-CM | POA: Diagnosis not present

## 2017-01-24 DIAGNOSIS — M62838 Other muscle spasm: Secondary | ICD-10-CM

## 2017-01-24 DIAGNOSIS — R279 Unspecified lack of coordination: Secondary | ICD-10-CM
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
## 2017-01-26 ENCOUNTER — Ambulatory Visit: Payer: Medicare Other | Admitting: Physical Therapy

## 2017-01-26 DIAGNOSIS — M6281 Muscle weakness (generalized): Secondary | ICD-10-CM

## 2017-01-26 DIAGNOSIS — M62838 Other muscle spasm: Secondary | ICD-10-CM

## 2017-01-26 DIAGNOSIS — R279 Unspecified lack of coordination: Secondary | ICD-10-CM
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
## 2017-01-26 NOTE — Patient Instructions (Addendum)
   ELASTIC BAND ROWS   Holding elastic band with both hands, draw back the band as you bend your elbows. Keep your elbows near the side of your body. 20x    ELASTIC BAND SHOULDER EXTENSION  While holding an elastic band in front of you with your elbows straight, pull the band down and back towards your side. 20x   HIP EXTENSION - STANDING  While standing, balance on one leg and move your other leg in a backward direction. Do not swing the leg. Perform smooth and controlled movements.   Keep your trunk stable and without arching during the movement.   Use your arms for support if needed for balance and safety.  Repeat 2 sets of 10 both sides    HIP ABDUCTION - STANDING   While standing, raise your leg out to the side. Keep your knee straight and maintain your toes pointed forward the entire time.    Use your arms for support if needed for balance and safety.  Repeat 2 sets of 10 both sides  [facility] [address] [place], [place] [address] Phone # [phone] Fax [phone]

## 2017-02-02 ENCOUNTER — Ambulatory Visit: Payer: Medicare Other | Admitting: Physical Therapy

## 2017-02-02 DIAGNOSIS — M6281 Muscle weakness (generalized): Secondary | ICD-10-CM | POA: Diagnosis not present

## 2017-02-02 DIAGNOSIS — R279 Unspecified lack of coordination: Secondary | ICD-10-CM

## 2017-02-02 DIAGNOSIS — M62838 Other muscle spasm: Secondary | ICD-10-CM
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
## 2017-02-02 NOTE — Patient Instructions (Signed)
   Squeeze ball between knees, brace abdominals and left pelvic floor - 20x    Transverse Abdominus Activation  Contract your lower abdominals as if you were trying to lift one leg from the table.  Initiate the movement but do no lift foot greater than 1 inch from the table.  Repeat opposite side. 20x each side

## 2017-02-09 ENCOUNTER — Other Ambulatory Visit: Payer: Self-pay | Admitting: Cardiovascular Disease

## 2017-03-15 ENCOUNTER — Other Ambulatory Visit: Payer: Self-pay | Admitting: Cardiovascular Disease

## 2017-03-16 ENCOUNTER — Other Ambulatory Visit: Payer: Self-pay | Admitting: Cardiovascular Disease

## 2017-03-16 NOTE — Telephone Encounter (Signed)
REFILL 

## 2017-04-11 ENCOUNTER — Other Ambulatory Visit: Payer: Self-pay | Admitting: Cardiovascular Disease

## 2017-04-11 NOTE — Telephone Encounter (Signed)
REFILL 

## 2017-05-05 ENCOUNTER — Other Ambulatory Visit: Payer: Self-pay | Admitting: Cardiovascular Disease

## 2017-05-05 NOTE — Telephone Encounter (Signed)
REFILL 

## 2017-05-23 ENCOUNTER — Ambulatory Visit (INDEPENDENT_AMBULATORY_CARE_PROVIDER_SITE_OTHER): Payer: Medicare Other | Admitting: Cardiovascular Disease

## 2017-05-23 ENCOUNTER — Encounter: Payer: Self-pay | Admitting: Cardiovascular Disease

## 2017-05-23 VITALS — BP 140/86 | HR 79 | Ht 70.0 in | Wt 223.0 lb

## 2017-05-23 DIAGNOSIS — E785 Hyperlipidemia, unspecified: Secondary | ICD-10-CM | POA: Diagnosis not present

## 2017-05-23 DIAGNOSIS — Z79899 Other long term (current) drug therapy: Secondary | ICD-10-CM | POA: Diagnosis not present

## 2017-05-23 DIAGNOSIS — E1165 Type 2 diabetes mellitus with hyperglycemia: Secondary | ICD-10-CM

## 2017-05-23 DIAGNOSIS — G4733 Obstructive sleep apnea (adult) (pediatric): Secondary | ICD-10-CM | POA: Diagnosis not present

## 2017-05-23 DIAGNOSIS — E1121 Type 2 diabetes mellitus with diabetic nephropathy: Secondary | ICD-10-CM | POA: Diagnosis not present

## 2017-05-23 DIAGNOSIS — IMO0002 Reserved for concepts with insufficient information to code with codable children: Secondary | ICD-10-CM

## 2017-05-23 DIAGNOSIS — I672 Cerebral atherosclerosis: Secondary | ICD-10-CM | POA: Diagnosis not present

## 2017-05-23 DIAGNOSIS — I10 Essential (primary) hypertension: Secondary | ICD-10-CM

## 2017-05-23 DIAGNOSIS — I5032 Chronic diastolic (congestive) heart failure: Secondary | ICD-10-CM

## 2017-05-23 MED ORDER — FUROSEMIDE 40 MG PO TABS: 40.0000 mg | ORAL_TABLET | ORAL | 3 refills | Status: DC

## 2017-05-23 MED ORDER — POTASSIUM CHLORIDE 20 MEQ PO PACK: PACK | ORAL | 3 refills | Status: DC

## 2017-05-23 NOTE — Patient Instructions (Signed)
Medication Instructions: Dr Sallyanne Kuster has recommended making the following medication changes: 1. DECREASE Furosemide - take 1 tablet 3 times a week 2. DECREASE Potassium - take 1 tablet twice a day, 3 times week  Labwork: Your physician recommends that you return for lab work at your earliest [place].  Testing/Procedures: NONE ORDERED  Follow-up: Dr Sallyanne Kuster recommends that you schedule a follow-up appointment in 12 months. You will receive a reminder letter in the mail two months in advance. If you don't receive a letter, please call our office to schedule the follow-up appointment.  If you need a refill on your cardiac medications before your next appointment, please call your pharmacy.
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
## 2017-05-28 LAB — LIPID PANEL
CHOL/HDL RATIO: 3.6 ratio (ref 0.0–5.0)
Cholesterol, Total: 146 mg/dL (ref 100–199)
HDL: 41 mg/dL (ref >39)
LDL CALC: 73 mg/dL (ref 0–99)
TRIGLYCERIDES: 158 mg/dL — AB (ref 0–149)
VLDL Cholesterol Cal: 32 mg/dL (ref 5–40)

## 2017-05-28 LAB — BASIC METABOLIC PANEL
BUN/Creatinine Ratio: 10 (ref 10–24)
BUN: 12 mg/dL (ref 8–27)
CALCIUM: 9.1 mg/dL (ref 8.6–10.2)
CHLORIDE: 97 mmol/L (ref 96–106)
CO2: 24 mmol/L (ref 20–29)
Creatinine, Ser: 1.23 mg/dL (ref 0.76–1.27)
GFR calc non Af Amer: 56 mL/min/{1.73_m2} — ABNORMAL LOW (ref >59)
GFR, EST AFRICAN AMERICAN: 65 mL/min/{1.73_m2} (ref >59)
GLUCOSE: 337 mg/dL — AB (ref 65–99)
POTASSIUM: 3.6 mmol/L (ref 3.5–5.2)
Sodium: 137 mmol/L (ref 134–144)

## 2017-05-28 LAB — HEMOGLOBIN A1C
Est. average glucose Bld gHb Est-mCnc: 312 mg/dL
Hgb A1c MFr Bld: 12.5 % — ABNORMAL HIGH (ref 4.8–5.6)

## 2017-06-04 ENCOUNTER — Other Ambulatory Visit: Payer: Self-pay | Admitting: Cardiovascular Disease

## 2017-06-06 NOTE — Telephone Encounter (Signed)
REFILL 

## 2017-07-05 ENCOUNTER — Encounter (HOSPITAL_COMMUNITY): Payer: Self-pay | Admitting: *Deleted

## 2017-07-05 ENCOUNTER — Emergency Department (HOSPITAL_COMMUNITY): Payer: Medicare Other

## 2017-07-05 ENCOUNTER — Inpatient Hospital Stay (HOSPITAL_COMMUNITY)
Admission: EM | Admit: 2017-07-05 | Discharge: 2017-07-08 | DRG: 206 | Disposition: A | Discharge status: Home Health | Payer: Medicare Other | Attending: General Surgery | Admitting: General Surgery

## 2017-07-05 DIAGNOSIS — S0101XA Laceration without foreign body of scalp, initial encounter: Secondary | ICD-10-CM | POA: Diagnosis present

## 2017-07-05 DIAGNOSIS — S2239XA Fracture of one rib, unspecified side, initial encounter for closed fracture: Secondary | ICD-10-CM

## 2017-07-05 DIAGNOSIS — E785 Hyperlipidemia, unspecified: Secondary | ICD-10-CM | POA: Diagnosis present

## 2017-07-05 DIAGNOSIS — G4733 Obstructive sleep apnea (adult) (pediatric): Secondary | ICD-10-CM | POA: Diagnosis present

## 2017-07-05 DIAGNOSIS — S2231XA Fracture of one rib, right side, initial encounter for closed fracture: Secondary | ICD-10-CM | POA: Diagnosis not present

## 2017-07-05 DIAGNOSIS — R40241 Glasgow coma scale score 13-15, unspecified time: Secondary | ICD-10-CM | POA: Diagnosis present

## 2017-07-05 DIAGNOSIS — S7012XA Contusion of left thigh, initial encounter: Secondary | ICD-10-CM | POA: Diagnosis present

## 2017-07-05 DIAGNOSIS — Z23 Encounter for immunization: Secondary | ICD-10-CM

## 2017-07-05 DIAGNOSIS — Z9119 Patient's noncompliance with other medical treatment and regimen: Secondary | ICD-10-CM

## 2017-07-05 DIAGNOSIS — Z79899 Other long term (current) drug therapy: Secondary | ICD-10-CM

## 2017-07-05 DIAGNOSIS — X58XXXA Exposure to other specified factors, initial encounter: Secondary | ICD-10-CM | POA: Diagnosis present

## 2017-07-05 DIAGNOSIS — Z9114 Patient's other noncompliance with medication regimen: Secondary | ICD-10-CM

## 2017-07-05 DIAGNOSIS — Z9989 Dependence on other enabling machines and devices: Secondary | ICD-10-CM

## 2017-07-05 DIAGNOSIS — S0003XA Contusion of scalp, initial encounter: Secondary | ICD-10-CM | POA: Diagnosis present

## 2017-07-05 DIAGNOSIS — Y92410 Unspecified street and highway as the place of occurrence of the external cause: Secondary | ICD-10-CM

## 2017-07-05 DIAGNOSIS — I5032 Chronic diastolic (congestive) heart failure: Secondary | ICD-10-CM | POA: Diagnosis present

## 2017-07-05 DIAGNOSIS — N179 Acute kidney failure, unspecified: Secondary | ICD-10-CM | POA: Diagnosis present

## 2017-07-05 DIAGNOSIS — W19XXXA Unspecified fall, initial encounter: Secondary | ICD-10-CM | POA: Diagnosis present

## 2017-07-05 DIAGNOSIS — Z8673 Personal history of transient ischemic attack (TIA), and cerebral infarction without residual deficits: Secondary | ICD-10-CM

## 2017-07-05 DIAGNOSIS — Z794 Long term (current) use of insulin: Secondary | ICD-10-CM

## 2017-07-05 DIAGNOSIS — I110 Hypertensive heart disease with heart failure: Secondary | ICD-10-CM | POA: Diagnosis present

## 2017-07-05 DIAGNOSIS — D62 Acute posthemorrhagic anemia: Secondary | ICD-10-CM | POA: Diagnosis present

## 2017-07-05 DIAGNOSIS — E1165 Type 2 diabetes mellitus with hyperglycemia: Secondary | ICD-10-CM | POA: Diagnosis present

## 2017-07-05 DIAGNOSIS — E876 Hypokalemia: Secondary | ICD-10-CM | POA: Diagnosis present

## 2017-07-05 HISTORY — DX: Essential (primary) hypertension: I10

## 2017-07-05 LAB — URINALYSIS, ROUTINE W REFLEX MICROSCOPIC
BILIRUBIN URINE: NEGATIVE
Glucose, UA: >=500 mg/dL — AB
HGB URINE DIPSTICK: NEGATIVE
KETONES UR: 5 mg/dL — AB
LEUKOCYTES UA: NEGATIVE
Nitrite: POSITIVE — AB
Protein, ur: >=300 mg/dL — AB
SPECIFIC GRAVITY, URINE: 1.021 (ref 1.005–1.030)
pH: 6 (ref 5.0–8.0)

## 2017-07-05 LAB — I-STAT TROPONIN, ED: Troponin i, poc: 0.01 ng/mL (ref 0.00–0.08)

## 2017-07-05 LAB — COMPREHENSIVE METABOLIC PANEL
ALT: 22 U/L (ref 17–63)
AST: 33 U/L (ref 15–41)
Albumin: 3.5 g/dL (ref 3.5–5.0)
Alkaline Phosphatase: 123 U/L (ref 38–126)
Anion gap: 15 (ref 5–15)
BUN: 13 mg/dL (ref 6–20)
CHLORIDE: 101 mmol/L (ref 101–111)
CO2: 20 mmol/L — AB (ref 22–32)
CREATININE: 1.5 mg/dL — AB (ref 0.61–1.24)
Calcium: 8.7 mg/dL — ABNORMAL LOW (ref 8.9–10.3)
GFR, EST AFRICAN AMERICAN: 50 mL/min — AB (ref >60)
GFR, EST NON AFRICAN AMERICAN: 43 mL/min — AB (ref >60)
Glucose, Bld: 444 mg/dL — ABNORMAL HIGH (ref 65–99)
POTASSIUM: 2.9 mmol/L — AB (ref 3.5–5.1)
SODIUM: 136 mmol/L (ref 135–145)
Total Bilirubin: 1.3 mg/dL — ABNORMAL HIGH (ref 0.3–1.2)
Total Protein: 6.3 g/dL — ABNORMAL LOW (ref 6.5–8.1)

## 2017-07-05 LAB — CBC
HCT: 44.8 % (ref 39.0–52.0)
HEMATOCRIT: 47.8 % (ref 39.0–52.0)
HEMOGLOBIN: 16.6 g/dL (ref 13.0–17.0)
Hemoglobin: 15.8 g/dL (ref 13.0–17.0)
MCH: 29 pg (ref 26.0–34.0)
MCH: 29.5 pg (ref 26.0–34.0)
MCHC: 34.7 g/dL (ref 30.0–36.0)
MCHC: 35.3 g/dL (ref 30.0–36.0)
MCV: 83.6 fL (ref 78.0–100.0)
MCV: 83.6 fL (ref 78.0–100.0)
PLATELETS: 261 10*3/uL (ref 150–400)
Platelets: 221 10*3/uL (ref 150–400)
RBC: 5.72 MIL/uL (ref 4.22–5.81)
RBC: 5.36 MIL/uL (ref 4.22–5.81)
RDW: 12.6 % (ref 11.5–15.5)
RDW: 13 % (ref 11.5–15.5)
WBC: 8.4 10*3/uL (ref 4.0–10.5)
WBC: 11.9 10*3/uL — ABNORMAL HIGH (ref 4.0–10.5)

## 2017-07-05 LAB — I-STAT CHEM 8, ED
BUN: 14 mg/dL (ref 6–20)
BUN: 16 mg/dL (ref 6–20)
CALCIUM ION: 0.93 mmol/L — AB (ref 1.15–1.40)
CHLORIDE: 99 mmol/L — AB (ref 101–111)
CREATININE: 1.4 mg/dL — AB (ref 0.61–1.24)
Calcium, Ion: 0.96 mmol/L — ABNORMAL LOW (ref 1.15–1.40)
Chloride: 100 mmol/L — ABNORMAL LOW (ref 101–111)
Creatinine, Ser: 1.3 mg/dL — ABNORMAL HIGH (ref 0.61–1.24)
Glucose, Bld: 451 mg/dL — ABNORMAL HIGH (ref 65–99)
Glucose, Bld: 432 mg/dL — ABNORMAL HIGH (ref 65–99)
HCT: 52 % (ref 39.0–52.0)
HEMATOCRIT: 50 % (ref 39.0–52.0)
HEMOGLOBIN: 17 g/dL (ref 13.0–17.0)
Hemoglobin: 17.7 g/dL — ABNORMAL HIGH (ref 13.0–17.0)
POTASSIUM: 2.8 mmol/L — AB (ref 3.5–5.1)
Potassium: 3.1 mmol/L — ABNORMAL LOW (ref 3.5–5.1)
Sodium: 137 mmol/L (ref 135–145)
Sodium: 138 mmol/L (ref 135–145)
TCO2: 22 mmol/L (ref 22–32)
TCO2: 25 mmol/L (ref 22–32)

## 2017-07-05 LAB — CREATININE, SERUM
Creatinine, Ser: 1.53 mg/dL — ABNORMAL HIGH (ref 0.61–1.24)
GFR calc Af Amer: 49 mL/min — ABNORMAL LOW (ref >60)
GFR calc non Af Amer: 42 mL/min — ABNORMAL LOW (ref >60)

## 2017-07-05 LAB — I-STAT CG4 LACTIC ACID, ED: LACTIC ACID, VENOUS: 1.73 mmol/L (ref 0.5–1.9)

## 2017-07-05 LAB — GLUCOSE, CAPILLARY
GLUCOSE-CAPILLARY: 326 mg/dL — AB (ref 65–99)
GLUCOSE-CAPILLARY: 397 mg/dL — AB (ref 65–99)
GLUCOSE-CAPILLARY: 402 mg/dL — AB (ref 65–99)

## 2017-07-05 LAB — PROTIME-INR
INR: 0.99
Prothrombin Time: 13 seconds (ref 11.4–15.2)

## 2017-07-05 LAB — HEMOGLOBIN A1C
Hgb A1c MFr Bld: 12.1 % — ABNORMAL HIGH (ref 4.8–5.6)
Mean Plasma Glucose: 300.57 mg/dL

## 2017-07-05 LAB — SAMPLE TO BLOOD BANK

## 2017-07-05 LAB — ETHANOL: Alcohol, Ethyl (B): <10 mg/dL (ref <10)

## 2017-07-05 LAB — CDS SEROLOGY

## 2017-07-05 MED ORDER — IOPAMIDOL (ISOVUE-300) INJECTION 61%
INTRAVENOUS | Status: AC
  Administered 2017-07-05: 80 mL
  Filled 2017-07-05: qty 100

## 2017-07-05 MED ORDER — HYDRALAZINE HCL 20 MG/ML IJ SOLN
10.0000 mg | Freq: Once | INTRAMUSCULAR | Status: AC
  Administered 2017-07-05: 10 mg via INTRAVENOUS
  Filled 2017-07-05: qty 1

## 2017-07-05 MED ORDER — SODIUM CHLORIDE 0.9 % IV BOLUS (SEPSIS)
1000.0000 mL | Freq: Once | INTRAVENOUS | Status: AC
  Administered 2017-07-05: 1000 mL via INTRAVENOUS

## 2017-07-05 MED ORDER — ONDANSETRON 4 MG PO TBDP: 4.0000 mg | ORAL_TABLET | Freq: Four times a day (QID) | ORAL | Status: DC | PRN

## 2017-07-05 MED ORDER — OXYCODONE HCL 5 MG PO TABS
5.0000 mg | ORAL_TABLET | ORAL | Status: DC | PRN
  Administered 2017-07-05: 5 mg via ORAL
  Filled 2017-07-05: qty 1

## 2017-07-05 MED ORDER — ONDANSETRON HCL 4 MG/2ML IJ SOLN
4.0000 mg | Freq: Four times a day (QID) | INTRAMUSCULAR | Status: DC | PRN
  Administered 2017-07-05: 4 mg via INTRAVENOUS
  Filled 2017-07-05 (×2): qty 2

## 2017-07-05 MED ORDER — BENAZEPRIL HCL 40 MG PO TABS
40.0000 mg | ORAL_TABLET | Freq: Every day | ORAL | Status: DC
  Administered 2017-07-05 – 2017-07-08 (×4): 40 mg via ORAL
  Filled 2017-07-05 (×5): qty 1

## 2017-07-05 MED ORDER — HYDROMORPHONE HCL 1 MG/ML IJ SOLN
1.0000 mg | Freq: Once | INTRAMUSCULAR | Status: AC
  Administered 2017-07-05: 1 mg via INTRAVENOUS

## 2017-07-05 MED ORDER — CEFAZOLIN SODIUM-DEXTROSE 2-4 GM/100ML-% IV SOLN
2.0000 g | Freq: Once | INTRAVENOUS | Status: AC
  Administered 2017-07-05: 2 g via INTRAVENOUS

## 2017-07-05 MED ORDER — INSULIN ASPART 100 UNIT/ML ~~LOC~~ SOLN
0.0000 [IU] | SUBCUTANEOUS | Status: DC
  Administered 2017-07-05: 11 [IU] via SUBCUTANEOUS
  Administered 2017-07-06: 3 [IU] via SUBCUTANEOUS
  Administered 2017-07-06: 2 [IU] via SUBCUTANEOUS

## 2017-07-05 MED ORDER — CEFAZOLIN SODIUM-DEXTROSE 2-4 GM/100ML-% IV SOLN
INTRAVENOUS | Status: AC
  Filled 2017-07-05: qty 100

## 2017-07-05 MED ORDER — TETANUS-DIPHTH-ACELL PERTUSSIS 5-2.5-18.5 LF-MCG/0.5 IM SUSP
0.5000 mL | Freq: Once | INTRAMUSCULAR | Status: AC
  Administered 2017-07-05: 0.5 mL via INTRAMUSCULAR

## 2017-07-05 MED ORDER — PANTOPRAZOLE SODIUM 40 MG IV SOLR
40.0000 mg | Freq: Every day | INTRAVENOUS | Status: DC
  Administered 2017-07-05: 40 mg via INTRAVENOUS
  Filled 2017-07-05: qty 40

## 2017-07-05 MED ORDER — ASPIRIN EC 81 MG PO TBEC
81.0000 mg | DELAYED_RELEASE_TABLET | Freq: Every day | ORAL | Status: DC
  Administered 2017-07-05 – 2017-07-08 (×4): 81 mg via ORAL
  Filled 2017-07-05 (×5): qty 1

## 2017-07-05 MED ORDER — HYDROMORPHONE HCL 1 MG/ML IJ SOLN
INTRAMUSCULAR | Status: AC
  Filled 2017-07-05: qty 1

## 2017-07-05 MED ORDER — TRAMADOL HCL 50 MG PO TABS
50.0000 mg | ORAL_TABLET | Freq: Four times a day (QID) | ORAL | Status: DC | PRN
  Administered 2017-07-05 – 2017-07-06 (×2): 50 mg via ORAL
  Filled 2017-07-05 (×2): qty 1

## 2017-07-05 MED ORDER — TAMSULOSIN HCL 0.4 MG PO CAPS: 0.4000 mg | ORAL_CAPSULE | Freq: Every day | ORAL | Status: DC

## 2017-07-05 MED ORDER — INSULIN ASPART 100 UNIT/ML ~~LOC~~ SOLN
20.0000 [IU] | Freq: Once | SUBCUTANEOUS | Status: AC
  Administered 2017-07-05: 20 [IU] via SUBCUTANEOUS

## 2017-07-05 MED ORDER — MORPHINE SULFATE 2 MG/ML IJ SOLN
INTRAMUSCULAR | Status: AC | PRN
  Administered 2017-07-05: 4 mg via INTRAVENOUS

## 2017-07-05 MED ORDER — SODIUM CHLORIDE 0.45 % IV SOLN: INTRAVENOUS | Status: DC

## 2017-07-05 MED ORDER — PANTOPRAZOLE SODIUM 40 MG PO TBEC
40.0000 mg | DELAYED_RELEASE_TABLET | Freq: Every day | ORAL | Status: DC
  Administered 2017-07-06 – 2017-07-08 (×3): 40 mg via ORAL
  Filled 2017-07-05 (×4): qty 1

## 2017-07-05 MED ORDER — POTASSIUM CHLORIDE 10 MEQ/100ML IV SOLN
10.0000 meq | INTRAVENOUS | Status: AC
  Administered 2017-07-05: 10 meq via INTRAVENOUS
  Filled 2017-07-05: qty 100

## 2017-07-05 MED ORDER — MORPHINE SULFATE (PF) 4 MG/ML IV SOLN
1.0000 mg | Freq: Four times a day (QID) | INTRAVENOUS | Status: DC | PRN
  Administered 2017-07-05 – 2017-07-07 (×2): 2 mg via INTRAVENOUS
  Filled 2017-07-05 (×3): qty 1

## 2017-07-05 MED ORDER — ENOXAPARIN SODIUM 40 MG/0.4ML ~~LOC~~ SOLN
40.0000 mg | SUBCUTANEOUS | Status: DC
  Administered 2017-07-06 – 2017-07-08 (×3): 40 mg via SUBCUTANEOUS
  Filled 2017-07-05 (×3): qty 0.4

## 2017-07-05 MED ORDER — TETANUS-DIPHTH-ACELL PERTUSSIS 5-2.5-18.5 LF-MCG/0.5 IM SUSP
INTRAMUSCULAR | Status: AC
  Administered 2017-07-05: 0.5 mL via INTRAMUSCULAR
  Filled 2017-07-05: qty 0.5

## 2017-07-05 MED ORDER — HYDRALAZINE HCL 20 MG/ML IJ SOLN
10.0000 mg | INTRAMUSCULAR | Status: DC | PRN
  Administered 2017-07-08: 10 mg via INTRAVENOUS
  Filled 2017-07-05 (×2): qty 1

## 2017-07-05 MED ORDER — PANTOPRAZOLE SODIUM 40 MG PO TBEC: 40.0000 mg | DELAYED_RELEASE_TABLET | Freq: Every day | ORAL | Status: DC

## 2017-07-05 MED ORDER — MORPHINE SULFATE (PF) 4 MG/ML IV SOLN
INTRAVENOUS | Status: AC
  Filled 2017-07-05: qty 1
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
## 2017-07-05 NOTE — ED Notes (Signed)
Pt given ice.  
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
## 2017-07-05 NOTE — Progress Notes (Signed)
Patient arrived to the unit via stretcher. Alert and oriented. Patient transferred to bed. VSS. Patient oriented to room. Will release orders and continue to monitor patients.

## 2017-07-05 NOTE — ED Notes (Signed)
Pt bec am reporting left upper leg pain. Swelling and bruising noted. Sensation pulse and movement intact distal

## 2017-07-05 NOTE — ED Notes (Signed)
Pt given ice water, pt began vomiting; pt sat up to 90 degrees, cleaned up and given new gown and linen; will continue to monitor

## 2017-07-05 NOTE — Progress Notes (Signed)
Pt [id number]- Pt asymptomatic, on call Dr. Kieth Brightly made aware and no new orders. Just rechecked CBG per schedule.
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
## 2017-07-05 NOTE — ED Notes (Signed)
Attempted report x 1; name and call back number provided 

## 2017-07-06 ENCOUNTER — Observation Stay (HOSPITAL_COMMUNITY): Payer: Medicare Other

## 2017-07-06 DIAGNOSIS — Z8673 Personal history of transient ischemic attack (TIA), and cerebral infarction without residual deficits: Secondary | ICD-10-CM | POA: Diagnosis not present

## 2017-07-06 DIAGNOSIS — S0003XA Contusion of scalp, initial encounter: Secondary | ICD-10-CM | POA: Diagnosis present

## 2017-07-06 DIAGNOSIS — E1165 Type 2 diabetes mellitus with hyperglycemia: Secondary | ICD-10-CM | POA: Diagnosis present

## 2017-07-06 DIAGNOSIS — R40241 Glasgow coma scale score 13-15, unspecified time: Secondary | ICD-10-CM | POA: Diagnosis present

## 2017-07-06 DIAGNOSIS — Z79899 Other long term (current) drug therapy: Secondary | ICD-10-CM | POA: Diagnosis not present

## 2017-07-06 DIAGNOSIS — E785 Hyperlipidemia, unspecified: Secondary | ICD-10-CM | POA: Diagnosis present

## 2017-07-06 DIAGNOSIS — E876 Hypokalemia: Secondary | ICD-10-CM | POA: Diagnosis present

## 2017-07-06 DIAGNOSIS — S7012XA Contusion of left thigh, initial encounter: Secondary | ICD-10-CM | POA: Diagnosis present

## 2017-07-06 DIAGNOSIS — W19XXXA Unspecified fall, initial encounter: Secondary | ICD-10-CM | POA: Diagnosis not present

## 2017-07-06 DIAGNOSIS — Z9114 Patient's other noncompliance with medication regimen: Secondary | ICD-10-CM | POA: Diagnosis not present

## 2017-07-06 DIAGNOSIS — S2231XA Fracture of one rib, right side, initial encounter for closed fracture: Secondary | ICD-10-CM | POA: Diagnosis present

## 2017-07-06 DIAGNOSIS — G4733 Obstructive sleep apnea (adult) (pediatric): Secondary | ICD-10-CM | POA: Diagnosis present

## 2017-07-06 DIAGNOSIS — N179 Acute kidney failure, unspecified: Secondary | ICD-10-CM | POA: Diagnosis present

## 2017-07-06 DIAGNOSIS — X58XXXA Exposure to other specified factors, initial encounter: Secondary | ICD-10-CM | POA: Diagnosis not present

## 2017-07-06 DIAGNOSIS — Z9989 Dependence on other enabling machines and devices: Secondary | ICD-10-CM | POA: Diagnosis not present

## 2017-07-06 DIAGNOSIS — Y92410 Unspecified street and highway as the place of occurrence of the external cause: Secondary | ICD-10-CM | POA: Diagnosis not present

## 2017-07-06 DIAGNOSIS — S0101XA Laceration without foreign body of scalp, initial encounter: Secondary | ICD-10-CM | POA: Diagnosis present

## 2017-07-06 DIAGNOSIS — Z794 Long term (current) use of insulin: Secondary | ICD-10-CM | POA: Diagnosis not present

## 2017-07-06 DIAGNOSIS — Z23 Encounter for immunization: Secondary | ICD-10-CM | POA: Diagnosis not present

## 2017-07-06 DIAGNOSIS — I5032 Chronic diastolic (congestive) heart failure: Secondary | ICD-10-CM | POA: Diagnosis present

## 2017-07-06 DIAGNOSIS — D62 Acute posthemorrhagic anemia: Secondary | ICD-10-CM | POA: Diagnosis present

## 2017-07-06 DIAGNOSIS — I110 Hypertensive heart disease with heart failure: Secondary | ICD-10-CM | POA: Diagnosis present

## 2017-07-06 DIAGNOSIS — Z9119 Patient's noncompliance with other medical treatment and regimen: Secondary | ICD-10-CM | POA: Diagnosis not present

## 2017-07-06 LAB — BASIC METABOLIC PANEL
Anion gap: 10 (ref 5–15)
BUN: 15 mg/dL (ref 6–20)
CALCIUM: 8 mg/dL — AB (ref 8.9–10.3)
CO2: 26 mmol/L (ref 22–32)
Chloride: 100 mmol/L — ABNORMAL LOW (ref 101–111)
Creatinine, Ser: 1.51 mg/dL — ABNORMAL HIGH (ref 0.61–1.24)
GFR calc Af Amer: 50 mL/min — ABNORMAL LOW (ref >60)
GFR, EST NON AFRICAN AMERICAN: 43 mL/min — AB (ref >60)
GLUCOSE: 145 mg/dL — AB (ref 65–99)
POTASSIUM: 2.3 mmol/L — AB (ref 3.5–5.1)
Sodium: 136 mmol/L (ref 135–145)

## 2017-07-06 LAB — GLUCOSE, CAPILLARY
GLUCOSE-CAPILLARY: 133 mg/dL — AB (ref 65–99)
GLUCOSE-CAPILLARY: 311 mg/dL — AB (ref 65–99)
Glucose-Capillary: 177 mg/dL — ABNORMAL HIGH (ref 65–99)
Glucose-Capillary: 278 mg/dL — ABNORMAL HIGH (ref 65–99)
Glucose-Capillary: 248 mg/dL — ABNORMAL HIGH (ref 65–99)

## 2017-07-06 LAB — CBC
HEMATOCRIT: 38.1 % — AB (ref 39.0–52.0)
Hemoglobin: 12.9 g/dL — ABNORMAL LOW (ref 13.0–17.0)
MCH: 28.2 pg (ref 26.0–34.0)
MCHC: 33.9 g/dL (ref 30.0–36.0)
MCV: 83.2 fL (ref 78.0–100.0)
PLATELETS: 257 10*3/uL (ref 150–400)
RBC: 4.58 MIL/uL (ref 4.22–5.81)
RDW: 13.1 % (ref 11.5–15.5)
WBC: 8.2 10*3/uL (ref 4.0–10.5)

## 2017-07-06 LAB — MAGNESIUM: Magnesium: 1.7 mg/dL (ref 1.7–2.4)

## 2017-07-06 MED ORDER — AMLODIPINE BESYLATE 5 MG PO TABS
5.0000 mg | ORAL_TABLET | Freq: Every day | ORAL | Status: DC
  Administered 2017-07-06 – 2017-07-08 (×3): 5 mg via ORAL
  Filled 2017-07-06 (×3): qty 1

## 2017-07-06 MED ORDER — INSULIN ASPART 100 UNIT/ML ~~LOC~~ SOLN
3.0000 [IU] | Freq: Three times a day (TID) | SUBCUTANEOUS | Status: DC
  Administered 2017-07-06 – 2017-07-08 (×6): 3 [IU] via SUBCUTANEOUS

## 2017-07-06 MED ORDER — OXYCODONE HCL 5 MG PO TABS: 5.0000 mg | ORAL_TABLET | Freq: Four times a day (QID) | ORAL | Status: DC | PRN

## 2017-07-06 MED ORDER — INSULIN ASPART 100 UNIT/ML ~~LOC~~ SOLN
0.0000 [IU] | Freq: Three times a day (TID) | SUBCUTANEOUS | Status: DC
  Administered 2017-07-06: 11 [IU] via SUBCUTANEOUS
  Administered 2017-07-06: 5 [IU] via SUBCUTANEOUS
  Administered 2017-07-07 (×2): 3 [IU] via SUBCUTANEOUS
  Administered 2017-07-07: 8 [IU] via SUBCUTANEOUS
  Administered 2017-07-08: 3 [IU] via SUBCUTANEOUS
  Administered 2017-07-08: 8 [IU] via SUBCUTANEOUS

## 2017-07-06 MED ORDER — FUROSEMIDE 40 MG PO TABS
40.0000 mg | ORAL_TABLET | ORAL | Status: DC
  Administered 2017-07-06 – 2017-07-08 (×2): 40 mg via ORAL
  Filled 2017-07-06 (×2): qty 1

## 2017-07-06 MED ORDER — ACETAMINOPHEN 325 MG PO TABS
650.0000 mg | ORAL_TABLET | Freq: Four times a day (QID) | ORAL | Status: DC | PRN
  Administered 2017-07-08: 650 mg via ORAL
  Filled 2017-07-06: qty 2

## 2017-07-06 MED ORDER — SODIUM CHLORIDE 0.45 % IV SOLN
INTRAVENOUS | Status: DC
  Administered 2017-07-06 – 2017-07-08 (×2): via INTRAVENOUS
  Filled 2017-07-06 (×5): qty 1000

## 2017-07-06 MED ORDER — ENSURE ENLIVE PO LIQD: 237.0000 mL | Freq: Two times a day (BID) | ORAL | Status: DC

## 2017-07-06 MED ORDER — OXYCODONE HCL 5 MG PO TABS
5.0000 mg | ORAL_TABLET | Freq: Four times a day (QID) | ORAL | Status: DC | PRN
  Administered 2017-07-07: 5 mg via ORAL
  Filled 2017-07-06: qty 1

## 2017-07-06 MED ORDER — GLUCERNA SHAKE PO LIQD
237.0000 mL | Freq: Two times a day (BID) | ORAL | Status: DC
  Administered 2017-07-06 – 2017-07-08 (×5): 237 mL via ORAL

## 2017-07-06 MED ORDER — ATORVASTATIN CALCIUM 20 MG PO TABS
20.0000 mg | ORAL_TABLET | Freq: Every day | ORAL | Status: DC
  Administered 2017-07-06 – 2017-07-07 (×2): 20 mg via ORAL
  Filled 2017-07-06 (×2): qty 1

## 2017-07-06 MED ORDER — POTASSIUM CHLORIDE 10 MEQ/100ML IV SOLN
10.0000 meq | INTRAVENOUS | Status: AC
Start: 2017-07-06 — End: 2017-07-06
  Administered 2017-07-06 (×4): 10 meq via INTRAVENOUS
  Filled 2017-07-06: qty 100

## 2017-07-06 MED ORDER — INSULIN GLARGINE 100 UNIT/ML ~~LOC~~ SOLN
15.0000 [IU] | Freq: Every day | SUBCUTANEOUS | Status: DC
  Administered 2017-07-06 – 2017-07-08 (×3): 15 [IU] via SUBCUTANEOUS
  Filled 2017-07-06 (×3): qty 0.15
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
## 2017-07-06 NOTE — Progress Notes (Addendum)
Inpatient Diabetes Program Recommendations  AACE/ADA: New Consensus Statement on Inpatient Glycemic Control (2015)  Target Ranges:  Prepandial:   less than 140 mg/dL      Peak postprandial:   less than 180 mg/dL (1-2 hours)      Critically ill patients:  140 - 180 mg/dL   Lab Results  Component Value Date   GLUCAP 133 (H) 07/06/2017   HGBA1C 12.1 (H) 07/05/2017    Review of Glycemic ControlResults for RYSON, BACHA (MRN [id number]) as of 07/06/2017 09:49  Ref. Range 07/05/2017 19:59 07/05/2017 22:01 07/05/2017 23:50 07/06/2017 04:12 07/06/2017 07:54  Glucose-Capillary Latest Ref Range: 65 - 99 mg/dL 402 (H) 397 (H) 326 (H) 177 (H) 133 (H)   Diabetes history: Type 2 DM Outpatient Diabetes medications: None Current orders for Inpatient glycemic control:  Novolog moderate q 4 hours  Inpatient Diabetes Program Recommendations:    Patient's A1C indicates average blood sugars of 303 mg/dL.  Unclear what patient was taking prior to stopping meds.  While in the hospital, consider adding Lantus 15 units daily and add Novolog 3 units tid with meals.  Consider changing Novolog correction to tid with meals and HS.   Thanks, Adah Perl, RN, BC-ADM Inpatient Diabetes Coordinator Pager [phone] (8a-5p)  1420:  Spoke with patient regarding diabetes. He states that his "numbers" were better about 6 weeks ago and he stopped all his DM medications.  He see's Dr. Arelia Sneddon in [place]. Patient states, I got messed up and I need a restart.  Based on A1C he likely will need insulin at home. Patient states he does not feel well.  Will have Diabetes Coord. Follow-up on 07/07/17.  Will follow.
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
## 2017-07-06 NOTE — Progress Notes (Signed)
Pt in bed resting, denies c/o chest pain. Call light within reach.

## 2017-07-06 NOTE — Progress Notes (Signed)
PT Cancellation Note  Patient Details Name: Micheal Holt MRN: [id number] DOB: 06-Jul-1941   Cancelled Treatment:    Reason Eval/Treat Not Completed: Medical issues which prohibited therapy.  K+ critical and will try again later as time allows.   Ramond Dial 07/06/2017, 10:06 AM   Mee Hives, PT MS Acute Rehab Dept. Number: [place]
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
## 2017-07-06 NOTE — Progress Notes (Signed)
OT Cancellation Note  Patient Details Name: Micheal Holt MRN: [id number] DOB: 03-25-1941   Cancelled Treatment:    Reason Eval/Treat Not Completed: Medical issues which prohibited therapy (Pt with K+ of 2.3. Will follow.)  [name] 07/06/2017, 1:33 PM
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
## 2017-07-06 NOTE — Progress Notes (Signed)
CRITICAL VALUE ALERT  Critical Value:  2.3  Date & Time Notied:  07/06/17 0715 Provider NotifiedBrooke Meuthen  Orders Received/Actions taken: pending

## 2017-07-07 LAB — BASIC METABOLIC PANEL
Anion gap: 9 (ref 5–15)
BUN: 16 mg/dL (ref 6–20)
CHLORIDE: 104 mmol/L (ref 101–111)
CO2: 23 mmol/L (ref 22–32)
CREATININE: 1.4 mg/dL — AB (ref 0.61–1.24)
Calcium: 8 mg/dL — ABNORMAL LOW (ref 8.9–10.3)
GFR calc Af Amer: 54 mL/min — ABNORMAL LOW (ref >60)
GFR calc non Af Amer: 47 mL/min — ABNORMAL LOW (ref >60)
Glucose, Bld: 109 mg/dL — ABNORMAL HIGH (ref 65–99)
POTASSIUM: 2.6 mmol/L — AB (ref 3.5–5.1)
SODIUM: 136 mmol/L (ref 135–145)

## 2017-07-07 LAB — CBC
HEMATOCRIT: 35.6 % — AB (ref 39.0–52.0)
HEMOGLOBIN: 12.1 g/dL — AB (ref 13.0–17.0)
MCH: 28.4 pg (ref 26.0–34.0)
MCHC: 34 g/dL (ref 30.0–36.0)
MCV: 83.6 fL (ref 78.0–100.0)
Platelets: 228 10*3/uL (ref 150–400)
RBC: 4.26 MIL/uL (ref 4.22–5.81)
RDW: 12.9 % (ref 11.5–15.5)
WBC: 7.1 10*3/uL (ref 4.0–10.5)

## 2017-07-07 LAB — GLUCOSE, CAPILLARY
GLUCOSE-CAPILLARY: 269 mg/dL — AB (ref 65–99)
Glucose-Capillary: 112 mg/dL — ABNORMAL HIGH (ref 65–99)
Glucose-Capillary: 192 mg/dL — ABNORMAL HIGH (ref 65–99)
Glucose-Capillary: 152 mg/dL — ABNORMAL HIGH (ref 65–99)

## 2017-07-07 LAB — MAGNESIUM: Magnesium: 1.8 mg/dL (ref 1.7–2.4)

## 2017-07-07 MED ORDER — POTASSIUM CHLORIDE 10 MEQ/100ML IV SOLN
10.0000 meq | INTRAVENOUS | Status: DC
  Administered 2017-07-07: 10 meq via INTRAVENOUS

## 2017-07-07 MED ORDER — POTASSIUM CHLORIDE CRYS ER 20 MEQ PO TBCR
40.0000 meq | EXTENDED_RELEASE_TABLET | Freq: Two times a day (BID) | ORAL | Status: AC
  Administered 2017-07-07 (×2): 40 meq via ORAL
  Filled 2017-07-07 (×2): qty 2

## 2017-07-07 MED ORDER — INSULIN STARTER KIT- SYRINGES (ENGLISH)
1.0000 | Freq: Once | Status: DC
  Filled 2017-07-07: qty 1

## 2017-07-07 MED ORDER — POTASSIUM CHLORIDE 10 MEQ/100ML IV SOLN
10.0000 meq | INTRAVENOUS | Status: AC
  Administered 2017-07-07 (×2): 10 meq via INTRAVENOUS
  Filled 2017-07-07 (×3): qty 100

## 2017-07-07 MED ORDER — LIVING WELL WITH DIABETES BOOK
Freq: Once | Status: DC
  Filled 2017-07-07: qty 1
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
## 2017-07-07 NOTE — Progress Notes (Signed)
CRITICAL VALUE ALERT  Critical Value:  K+ 2.6  Date & Time Notied: 07/07/2017   Provider Notified: Jerene Pitch, PA   Orders Received/Actions taken: await
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
## 2017-07-07 NOTE — Progress Notes (Signed)
Inpatient Diabetes Program Recommendations  AACE/ADA: New Consensus Statement on Inpatient Glycemic Control (2015)  Target Ranges:  Prepandial:   less than 140 mg/dL      Peak postprandial:   less than 180 mg/dL (1-2 hours)      Critically ill patients:  140 - 180 mg/dL   Lab Results  Component Value Date   GLUCAP 269 (H) 07/07/2017   HGBA1C 12.1 (H) 07/05/2017    Review of Glycemic Control Results for Micheal Holt, Micheal Holt (MRN [id number]) as of 07/07/2017 13:23  Ref. Range 07/06/2017 12:48 07/06/2017 16:05 07/06/2017 20:10 07/07/2017 08:03 07/07/2017 12:44  Glucose-Capillary Latest Ref Range: 65 - 99 mg/dL 278 (H) 311 (H) 248 (H) 112 (H) 269 (H)   Diabetes history: Type 2 DM Outpatient Diabetes medications: None Current orders for Inpatient glycemic control:  Lantus 15 units qd +Novolog 3 units tid meal coverage + Novolog moderate q 4 hours  Inpatient Diabetes Program Recommendations:   Spoke with pt @ bedside about A1C results with them and explained what an A1C is, basic pathophysiology of DM Type 2, basic home care, basic diabetes diet nutrition principles, importance of checking CBGs and maintaining good CBG control to prevent long-term and short-term complications. Reviewed signs and symptoms of hyperglycemia and hypoglycemia and how to treat hypoglycemia at home. Also reviewed blood sugar goals at home.  RNs to provide ongoing basic DM education at bedside with this patient. Have ordered educational booklet, insulin starter kit, and DM videos.  Patient shared that he has been drinking a lot of orange juice after he came off of the diabetes medications and not careful what he was eating or drinking. Patient states willingness to start insulin if he needs to. Nurses please start insulin injection teaching to prepare for D/C if goes home on insulin and allow patient to give injections. Based on current basal insulin needs, may discharge home on Novolin 70/30 insulin mix from [facility] for approx.  $25 per vial. Novolin 70/30  Mix 11 units bid would equal approx. 15 units basal + 7 units meal coverage.  Thank you, Nani Gasser. Shanara Schnieders, RN, MSN, CDE  Diabetes Coordinator Inpatient Glycemic Control Team Team Pager [phone] (8am-5pm) 07/07/2017 1:39 PM
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
## 2017-07-08 LAB — BASIC METABOLIC PANEL
ANION GAP: 9 (ref 5–15)
BUN: 13 mg/dL (ref 6–20)
CALCIUM: 8.3 mg/dL — AB (ref 8.9–10.3)
CO2: 24 mmol/L (ref 22–32)
CREATININE: 1.21 mg/dL (ref 0.61–1.24)
Chloride: 107 mmol/L (ref 101–111)
GFR calc Af Amer: >60 mL/min (ref >60)
GFR, EST NON AFRICAN AMERICAN: 56 mL/min — AB (ref >60)
GLUCOSE: 122 mg/dL — AB (ref 65–99)
Potassium: 3.4 mmol/L — ABNORMAL LOW (ref 3.5–5.1)
Sodium: 140 mmol/L (ref 135–145)

## 2017-07-08 LAB — GLUCOSE, CAPILLARY
GLUCOSE-CAPILLARY: 276 mg/dL — AB (ref 65–99)
Glucose-Capillary: 173 mg/dL — ABNORMAL HIGH (ref 65–99)

## 2017-07-08 MED ORDER — PANTOPRAZOLE SODIUM 40 MG PO TBEC: 40.0000 mg | DELAYED_RELEASE_TABLET | Freq: Every day | ORAL | 0 refills | Status: DC

## 2017-07-08 MED ORDER — CLOPIDOGREL BISULFATE 75 MG PO TABS: 75.0000 mg | ORAL_TABLET | Freq: Every day | ORAL | 0 refills | Status: DC

## 2017-07-08 MED ORDER — AMLODIPINE BESYLATE 5 MG PO TABS: 5.0000 mg | ORAL_TABLET | Freq: Every day | ORAL | 0 refills | Status: DC

## 2017-07-08 MED ORDER — ATORVASTATIN CALCIUM 20 MG PO TABS: 20.0000 mg | ORAL_TABLET | Freq: Every day | ORAL | 0 refills | Status: DC

## 2017-07-08 MED ORDER — INSULIN NPH ISOPHANE & REGULAR (70-30) 100 UNIT/ML ~~LOC~~ SUSP: 11.0000 [IU] | Freq: Two times a day (BID) | SUBCUTANEOUS | 2 refills | Status: DC

## 2017-07-08 MED ORDER — TAMSULOSIN HCL 0.4 MG PO CAPS: 0.4000 mg | ORAL_CAPSULE | Freq: Every day | ORAL | 0 refills | Status: DC

## 2017-07-08 MED ORDER — BENAZEPRIL HCL 40 MG PO TABS: 40.0000 mg | ORAL_TABLET | Freq: Every day | ORAL | 0 refills | Status: DC

## 2017-07-08 MED ORDER — POTASSIUM CHLORIDE CRYS ER 20 MEQ PO TBCR: 20.0000 meq | EXTENDED_RELEASE_TABLET | Freq: Two times a day (BID) | ORAL | 0 refills | Status: DC

## 2017-07-08 MED ORDER — TRAMADOL HCL 50 MG PO TABS: 50.0000 mg | ORAL_TABLET | Freq: Four times a day (QID) | ORAL | 0 refills | Status: DC | PRN

## 2017-07-08 MED ORDER — FUROSEMIDE 40 MG PO TABS: 40.0000 mg | ORAL_TABLET | ORAL | 0 refills | Status: DC

## 2017-07-08 NOTE — Clinical Social Work Note (Signed)
Clinical Social Worker met with patient at bedside to offer support and discuss patient needs at discharge.  Patient states that he lives at home with his wife who has dementia and plans to return home at discharge.  Per patient, RN confirmed, patient daughter plans to provide patient with transport at discharge.    Clinical Social Worker inquired about current substance use.  Patient denies all alcohol or drug use at this time.  SBIRT complete.  No resources needed.  Barbette Or, [place]
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
## 2017-07-08 NOTE — Progress Notes (Signed)
Pt left before getting RW delivered to his room.  Called [facility]; they will deliver RW to his home.    Reinaldo Raddle, RN, BSN  Trauma/Neuro ICU Case Manager [phone]
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
## 2017-07-08 NOTE — Discharge Instructions (Signed)

## 2017-07-08 NOTE — Progress Notes (Signed)
Micheal Holt to be D/C'd  per MD order. Discussed with the patient and all questions fully answered.  Notified patient they had ordered him a walker and it should be coming to room however when his wife arrived they did not want to wait on walker. Notified Almyra Free (case worker) and left message to switch it over to home delivery of walker.   Also verified with patient he would now be on insulin injections, offered teaching he stated he had been on that before he knew how to do it.   VSS, Skin clean, dry and intact without evidence of skin break down, no evidence of skin tears noted.  IV catheter discontinued intact. Site without signs and symptoms of complications. Dressing and pressure applied.  An After Visit Summary was printed and given to the patient. Patient received prescription.  D/c education completed with patient/family including follow up instructions, medication list, d/c activities limitations if indicated, with other d/c instructions as indicated by MD - patient able to verbalize understanding, all questions fully answered.   Patient instructed to return to ED, call 911, or call MD for any changes in condition.   Patient to be escorted via [place], and D/C home via private auto.

## 2017-07-10 ENCOUNTER — Encounter: Payer: Self-pay | Admitting: Cardiovascular Disease

## 2018-02-24 ENCOUNTER — Encounter: Payer: Self-pay | Admitting: Internal Medicine

## 2018-02-24 ENCOUNTER — Telehealth: Payer: Self-pay

## 2018-02-24 ENCOUNTER — Other Ambulatory Visit (INDEPENDENT_AMBULATORY_CARE_PROVIDER_SITE_OTHER): Payer: Medicare Other

## 2018-02-24 ENCOUNTER — Other Ambulatory Visit: Payer: Self-pay

## 2018-02-24 DIAGNOSIS — R197 Diarrhea, unspecified: Secondary | ICD-10-CM

## 2018-02-24 DIAGNOSIS — R159 Full incontinence of feces: Secondary | ICD-10-CM | POA: Diagnosis not present

## 2018-02-24 LAB — COMPREHENSIVE METABOLIC PANEL
ALBUMIN: 3.5 g/dL (ref 3.5–5.2)
ALT: 8 U/L (ref 0–53)
AST: 8 U/L (ref 0–37)
Alkaline Phosphatase: 104 U/L (ref 39–117)
BUN: 15 mg/dL (ref 6–23)
CHLORIDE: 102 meq/L (ref 96–112)
CO2: 25 meq/L (ref 19–32)
Calcium: 8.7 mg/dL (ref 8.4–10.5)
Creatinine, Ser: 1.58 mg/dL — ABNORMAL HIGH (ref 0.40–1.50)
GFR: 45.31 mL/min — ABNORMAL LOW (ref >60.00)
Glucose, Bld: 402 mg/dL — ABNORMAL HIGH (ref 70–99)
POTASSIUM: 3 meq/L — AB (ref 3.5–5.1)
SODIUM: 137 meq/L (ref 135–145)
Total Bilirubin: 0.8 mg/dL (ref 0.2–1.2)
Total Protein: 6.1 g/dL (ref 6.0–8.3)

## 2018-02-24 LAB — CBC WITH DIFFERENTIAL/PLATELET
BASOS PCT: 1.1 % (ref 0.0–3.0)
Basophils Absolute: 0.1 10*3/uL (ref 0.0–0.1)
EOS PCT: 1.4 % (ref 0.0–5.0)
Eosinophils Absolute: 0.1 10*3/uL (ref 0.0–0.7)
HEMATOCRIT: 45.9 % (ref 39.0–52.0)
HEMOGLOBIN: 15.8 g/dL (ref 13.0–17.0)
Lymphocytes Relative: 11.9 % — ABNORMAL LOW (ref 12.0–46.0)
Lymphs Abs: 0.8 10*3/uL (ref 0.7–4.0)
MCHC: 34.5 g/dL (ref 30.0–36.0)
MCV: 84.2 fl (ref 78.0–100.0)
MONO ABS: 0.4 10*3/uL (ref 0.1–1.0)
Monocytes Relative: 6.4 % (ref 3.0–12.0)
NEUTROS ABS: 5.3 10*3/uL (ref 1.4–7.7)
Neutrophils Relative %: 79.2 % — ABNORMAL HIGH (ref 43.0–77.0)
PLATELETS: 265 10*3/uL (ref 150.0–400.0)
RBC: 5.45 Mil/uL (ref 4.22–5.81)
RDW: 13.8 % (ref 11.5–15.5)
WBC: 6.7 10*3/uL (ref 4.0–10.5)

## 2018-02-24 NOTE — Telephone Encounter (Signed)
Ordered labs and stool studies  Glucose was 402 and K 3  I told him to see PCP - hopefully today or Monday and go to urgent care or ED if worse  He says PCP stopped his insulin last year

## 2018-02-24 NOTE — Progress Notes (Signed)
Patient has very high blood sugar and low potassium he has diabetes   This needs attention soon  He needs to see his PCP very soon - today or Monday  I spoke to the patient.  Will send labs to Dr. Arelia Sneddon

## 2018-02-24 NOTE — Telephone Encounter (Signed)
Patient presents in person to the office with complaints of diarrhea for "couple of weeks". Worsening in frequency past "3 or 4 days." He states he has had 4 loose bowel movements this morning. Denies bloody diarrhea, vomiting or fevers. No abdominal tenderness admitted. States "but it don't feel good, but that's not my issue."  He has "experimented with not eating" in order to stop his diarrhea. "I carry extra clothes with me" and he will "stop on the side of the road if I have to." Agrees to lab work today. Agrees to follow BRAT diet. Reviewed this diet in detail with him. Discussed in detail maintaining hydration. At the end of the conversation the patient states he has also stopped some of his medications in an effort to find a cause for the diarrhea. Encouraged to communicate this with his PCP Dr Arelia Sneddon.

## 2018-02-24 NOTE — Telephone Encounter (Signed)
Noted  

## 2018-02-27 ENCOUNTER — Other Ambulatory Visit: Payer: Medicare Other

## 2018-02-27 DIAGNOSIS — R197 Diarrhea, unspecified: Secondary | ICD-10-CM

## 2018-02-27 DIAGNOSIS — R159 Full incontinence of feces: Secondary | ICD-10-CM

## 2018-02-27 NOTE — Telephone Encounter (Signed)
Spoke with Dr Arelia Sneddon office [phone] fax [phone] Patient walked into their office on Friday. He was seen by the APP.  Labs from here faxed to their office.

## 2018-03-02 LAB — GASTROINTESTINAL PATHOGEN PANEL PCR
C. difficile Tox A/B, PCR: NOT DETECTED
CAMPYLOBACTER, PCR: NOT DETECTED
Cryptosporidium, PCR: NOT DETECTED
E coli (ETEC) LT/ST PCR: NOT DETECTED
E coli (STEC) stx1/stx2, PCR: NOT DETECTED
E coli 0157, PCR: NOT DETECTED
Giardia lamblia, PCR: NOT DETECTED
NOROVIRUS, PCR: NOT DETECTED
ROTAVIRUS, PCR: NOT DETECTED
SALMONELLA, PCR: NOT DETECTED
SHIGELLA, PCR: NOT DETECTED

## 2018-03-03 NOTE — Progress Notes (Signed)
No infection  1) How is diarrhea? 2) Did he see PCP for hyperglycemia and get treated?

## 2018-07-25 DIAGNOSIS — I4891 Unspecified atrial fibrillation: Secondary | ICD-10-CM

## 2018-08-01 ENCOUNTER — Ambulatory Visit
Admission: RE | Admit: 2018-08-01 | Discharge: 2018-08-01 | Disposition: A | Discharge status: Home / Self Care | Payer: Medicare Other | Source: Ambulatory Visit | Attending: Family Medicine | Admitting: Family Medicine

## 2018-08-01 ENCOUNTER — Other Ambulatory Visit: Payer: Self-pay | Admitting: Family Medicine

## 2018-08-01 DIAGNOSIS — R0600 Dyspnea, unspecified: Secondary | ICD-10-CM

## 2018-08-02 ENCOUNTER — Encounter: Payer: Self-pay | Admitting: Cardiovascular Disease

## 2018-08-02 NOTE — Progress Notes (Signed)
Mr. Ludington is in his primary care provider's office, Dr. Arelia Sneddon.   He has mild-moderate exertional dyspnea.  He is in mild heart failure and has new onset atrial fibrillation. Ventricular rate is reasonably controlled at rest, 09-295 bpm.  Systolic blood pressure is relatively low around 100 mmHg.  Chest x-ray reportedly shows mild/early pulmonary congestion. He has a long-standing history of chronic diastolic heart failure which was easy to control on a low-dose of diuretics until recently.  Left ventricular systolic function was normal at last assessment by echo 2012.  Renal function is mildly to moderately impaired with the most recent creatinine of 1.48. (GFR around 50). Discussed with Dr. Arelia Sneddon.  Will stop aspirin and clopidogrel and start Eliquis 5 mg twice daily. Increase furosemide to daily.  He was taking it 3 times weekly. We will make arrangements for him to be seen in our office next week with repeat metabolic panel at that time.  If he worsens during the holiday weekend he may require hospitalization.  Sanda Klein, MD, [facility] [phone] office [phone] pager

## 2018-08-07 ENCOUNTER — Other Ambulatory Visit: Payer: Self-pay | Admitting: Cardiovascular Disease

## 2018-08-10 ENCOUNTER — Ambulatory Visit (INDEPENDENT_AMBULATORY_CARE_PROVIDER_SITE_OTHER): Payer: Medicare Other | Admitting: Physician Assistant

## 2018-08-10 VITALS — BP 170/100 | HR 87 | Ht 70.0 in | Wt 215.2 lb

## 2018-08-10 DIAGNOSIS — I5032 Chronic diastolic (congestive) heart failure: Secondary | ICD-10-CM

## 2018-08-10 DIAGNOSIS — I4819 Other persistent atrial fibrillation: Secondary | ICD-10-CM

## 2018-08-10 DIAGNOSIS — E119 Type 2 diabetes mellitus without complications: Secondary | ICD-10-CM

## 2018-08-10 DIAGNOSIS — I672 Cerebral atherosclerosis: Secondary | ICD-10-CM

## 2018-08-10 DIAGNOSIS — I10 Essential (primary) hypertension: Secondary | ICD-10-CM

## 2018-08-10 DIAGNOSIS — E785 Hyperlipidemia, unspecified: Secondary | ICD-10-CM

## 2018-08-10 DIAGNOSIS — G4733 Obstructive sleep apnea (adult) (pediatric): Secondary | ICD-10-CM

## 2018-08-10 LAB — CBC WITH DIFFERENTIAL/PLATELET
Basophils Absolute: 0.1 10*3/uL (ref 0.0–0.2)
Basos: 1 %
EOS (ABSOLUTE): 0.1 10*3/uL (ref 0.0–0.4)
Eos: 2 %
Hematocrit: 49 % (ref 37.5–51.0)
Hemoglobin: 16.1 g/dL (ref 13.0–17.7)
Immature Grans (Abs): 0 10*3/uL (ref 0.0–0.1)
Immature Granulocytes: 0 %
Lymphocytes Absolute: 1.4 10*3/uL (ref 0.7–3.1)
Lymphs: 21 %
MCH: 27.2 pg (ref 26.6–33.0)
MCHC: 32.9 g/dL (ref 31.5–35.7)
MCV: 83 fL (ref 79–97)
Monocytes Absolute: 0.5 10*3/uL (ref 0.1–0.9)
Monocytes: 8 %
Neutrophils Absolute: 4.3 10*3/uL (ref 1.4–7.0)
Neutrophils: 68 %
Platelets: 363 10*3/uL (ref 150–450)
RBC: 5.92 x10E6/uL — ABNORMAL HIGH (ref 4.14–5.80)
RDW: 14.2 % (ref 12.3–15.4)
WBC: 6.4 10*3/uL (ref 3.4–10.8)

## 2018-08-10 LAB — BASIC METABOLIC PANEL
BUN/Creatinine Ratio: 14 (ref 10–24)
BUN: 25 mg/dL (ref 8–27)
CO2: 23 mmol/L (ref 20–29)
Calcium: 9.1 mg/dL (ref 8.6–10.2)
Chloride: 99 mmol/L (ref 96–106)
Creatinine, Ser: 1.81 mg/dL — ABNORMAL HIGH (ref 0.76–1.27)
GFR calc Af Amer: 40 mL/min/{1.73_m2} — ABNORMAL LOW (ref >59)
GFR calc non Af Amer: 35 mL/min/{1.73_m2} — ABNORMAL LOW (ref >59)
Glucose: 144 mg/dL — ABNORMAL HIGH (ref 65–99)
POTASSIUM: 3.5 mmol/L (ref 3.5–5.2)
SODIUM: 142 mmol/L (ref 134–144)

## 2018-08-10 MED ORDER — LISINOPRIL 5 MG PO TABS: 5.0000 mg | ORAL_TABLET | Freq: Every day | ORAL | 3 refills | Status: DC

## 2018-08-10 MED ORDER — APIXABAN 5 MG PO TABS: 5.0000 mg | ORAL_TABLET | Freq: Two times a day (BID) | ORAL | 3 refills | Status: DC

## 2018-08-10 MED ORDER — METOPROLOL SUCCINATE ER 25 MG PO TB24: 25.0000 mg | ORAL_TABLET | Freq: Every day | ORAL | 3 refills | Status: DC
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
## 2018-08-10 NOTE — Patient Instructions (Signed)
Medication Instructions:  Increase Eliquis to 5 mg twice a day Start Metoprolol Succinate 25 mg daily Start Lisinopril 5 mg daily  If you need a refill on your cardiac medications before your next appointment, please call your pharmacy.   Lab work: Risk manager today If you have labs (blood work) drawn today and your tests are completely normal, you will receive your results only by: Marland Kitchen MyChart Message (if you have MyChart) OR . A paper copy in the mail If you have any lab test that is abnormal or we need to change your treatment, we will call you to review the results.  Testing/Procedures: Schedule Echo  Follow-Up: At [facility], you and your health needs are our priority.  As part of our continuing mission to provide you with exceptional heart care, we have created designated Provider Care Teams.  These Care Teams include your primary Cardiologist (physician) and Advanced Practice Providers (APPs -  Physician Assistants and Nurse Practitioners) who all work together to provide you with the care you need, when you need it. . Schedule follow up appointment with Almyra Deforest PA in 3 weeks with a EKG

## 2018-08-11 ENCOUNTER — Telehealth: Payer: Self-pay | Admitting: Physician Assistant

## 2018-08-11 DIAGNOSIS — N182 Chronic kidney disease, stage 2 (mild): Secondary | ICD-10-CM

## 2018-08-11 NOTE — Telephone Encounter (Signed)
Informed pt of results of recent BMET. Pt stated he was still taking 80 mg Lasix and 40 meq potassium daily. Pt stated he is sometimes unable to take the potassium bc the tablets are so big and sometimes he throws them up. Per Micheal Holt, ok to cut the 20 meq tablets in half.   Informed pt to take only 20 meq of potassium daily and 40 mg of lasix daily and that he will need to come in to the office next Friday (08/18/18) for repeat BMET. Pt stated he is able to do that. Pt also stated he is the one who manages his medicines.  Order for BMET placed and will be given to [facility] in the Lab. Informed pt he will not need an appt for this.   Pt verbalized thanks for the call and understanding of new med instructions. Med list updated.

## 2018-08-11 NOTE — Telephone Encounter (Signed)
-----   Message from [place], [place] sent at 08/11/2018 11:16 AM EST ----- Electrolyte ok, but renal function worsened compare to 5 month ago, please confirm with the patient again the dosage of lasix he is taking. He told me he is on 80mg  daily of lasix with 19meq of KCl, if so, if advise him to reduce lasix to 40mg  daily and potassium to 24meq daily. He was quite confused about his medication during last visit. Check with patient to see if anyone else is managing his meds.

## 2018-08-12 ENCOUNTER — Encounter: Payer: Self-pay | Admitting: Physician Assistant

## 2018-08-17 ENCOUNTER — Other Ambulatory Visit: Payer: Self-pay

## 2018-08-17 ENCOUNTER — Encounter (HOSPITAL_COMMUNITY): Payer: Self-pay | Admitting: *Deleted

## 2018-08-17 ENCOUNTER — Ambulatory Visit (HOSPITAL_COMMUNITY): Payer: Medicare Other | Attending: Cardiovascular Disease

## 2018-08-17 DIAGNOSIS — I10 Essential (primary) hypertension: Secondary | ICD-10-CM

## 2018-08-17 DIAGNOSIS — I5032 Chronic diastolic (congestive) heart failure: Secondary | ICD-10-CM | POA: Diagnosis present

## 2018-08-17 DIAGNOSIS — I672 Cerebral atherosclerosis: Secondary | ICD-10-CM

## 2018-08-17 NOTE — Progress Notes (Signed)
Patient ID: Micheal Holt, male   DOB: 1940/12/26, 77 y.o.   MRN: [id number] Consulted with DOD Dr. Johnsie Cancel about EF. Per DOD changed appointment time 09/01/18 to 08/24/2018 and discharged patient. At discharge patient appeared to be stable.

## 2018-08-22 ENCOUNTER — Other Ambulatory Visit: Payer: Self-pay

## 2018-08-22 ENCOUNTER — Telehealth: Payer: Self-pay | Admitting: Physician Assistant

## 2018-08-22 MED ORDER — APIXABAN 5 MG PO TABS: 5.0000 mg | ORAL_TABLET | Freq: Two times a day (BID) | ORAL | 1 refills | Status: DC

## 2018-08-22 NOTE — Telephone Encounter (Signed)
Follow Up:    Returning your call, concerning his Echo results. 

## 2018-08-24 ENCOUNTER — Encounter: Payer: Self-pay | Admitting: Physician Assistant

## 2018-08-24 ENCOUNTER — Ambulatory Visit (INDEPENDENT_AMBULATORY_CARE_PROVIDER_SITE_OTHER): Payer: Medicare Other | Admitting: Physician Assistant

## 2018-08-24 VITALS — BP 126/84 | HR 77 | Ht 70.0 in | Wt 214.0 lb

## 2018-08-24 DIAGNOSIS — I4819 Other persistent atrial fibrillation: Secondary | ICD-10-CM | POA: Diagnosis not present

## 2018-08-24 DIAGNOSIS — I5032 Chronic diastolic (congestive) heart failure: Secondary | ICD-10-CM

## 2018-08-24 DIAGNOSIS — I10 Essential (primary) hypertension: Secondary | ICD-10-CM | POA: Diagnosis not present

## 2018-08-24 DIAGNOSIS — N182 Chronic kidney disease, stage 2 (mild): Secondary | ICD-10-CM
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
## 2018-08-24 NOTE — Patient Instructions (Signed)
Medication Instructions:  No changes  If you need a refill on your cardiac medications before your next appointment, please call your pharmacy.   Lab work: Your physician recommends that you return for lab work today: BMET  If you have labs (blood work) drawn today and your tests are completely normal, you will receive your results only by: Marland Kitchen MyChart Message (if you have MyChart) OR . A paper copy in the mail If you have any lab test that is abnormal or we need to change your treatment, we will call you to review the results.   Follow-Up: At [facility], you and your health needs are our priority.  As part of our continuing mission to provide you with exceptional heart care, we have created designated Provider Care Teams.  These Care Teams include your primary Cardiologist (physician) and Advanced Practice Providers (APPs -  Physician Assistants and Nurse Practitioners) who all work together to provide you with the care you need, when you need it. . Please follow-up with your primary care provider to discuss blood sugar management  Any Other Special Instructions Will Be Listed Below (If Applicable).  Do not take your Furosemide or potassium until we contact you. When you resume potassium, it is ok to crush it and dissolve in a spoonful of applesauce.

## 2018-08-25 ENCOUNTER — Other Ambulatory Visit: Payer: Self-pay | Admitting: Physician Assistant

## 2018-08-25 LAB — BASIC METABOLIC PANEL
BUN/Creatinine Ratio: 14 (ref 10–24)
BUN: 27 mg/dL (ref 8–27)
CALCIUM: 8.9 mg/dL (ref 8.6–10.2)
CO2: 26 mmol/L (ref 20–29)
Chloride: 101 mmol/L (ref 96–106)
Creatinine, Ser: 1.97 mg/dL — ABNORMAL HIGH (ref 0.76–1.27)
GFR calc Af Amer: 37 mL/min/{1.73_m2} — ABNORMAL LOW (ref >59)
GFR calc non Af Amer: 32 mL/min/{1.73_m2} — ABNORMAL LOW (ref >59)
GLUCOSE: 89 mg/dL (ref 65–99)
Potassium: 3.5 mmol/L (ref 3.5–5.2)
Sodium: 144 mmol/L (ref 134–144)

## 2018-08-25 MED ORDER — FUROSEMIDE 40 MG PO TABS: 40.0000 mg | ORAL_TABLET | Freq: Every day | ORAL | 11 refills | Status: DC

## 2018-08-25 NOTE — Telephone Encounter (Signed)
Patient was given ECHO results, and came to follow up appointment on 08/24/18

## 2018-09-01 ENCOUNTER — Ambulatory Visit: Payer: TRICARE For Life (TFL) | Admitting: Physician Assistant

## 2018-09-04 ENCOUNTER — Other Ambulatory Visit: Payer: Self-pay

## 2018-09-04 MED ORDER — APIXABAN 5 MG PO TABS: 5.0000 mg | ORAL_TABLET | Freq: Two times a day (BID) | ORAL | 1 refills | Status: DC

## 2018-09-04 NOTE — Progress Notes (Signed)
We need to be sure he is taking his anticoagulant every day before we discuss cardioversion. MCr

## 2018-09-08 ENCOUNTER — Telehealth: Payer: Self-pay | Admitting: *Deleted

## 2018-09-08 NOTE — Telephone Encounter (Signed)
-----   Message from Lonn Georgia, PA-C sent at 09/08/2018 12:42 PM EST ----- Please ask him to bring med bottles - for everything that he takes, to the visit next week.   Thanks

## 2018-09-08 NOTE — Telephone Encounter (Signed)
Called pt to make him aware that he is to bring all of his medication bottles with him to his upcoming appt, 09/11/18 @ 3:00 with Rosaria Ferries, PA-C. Pt verbalized understanding.

## 2018-09-11 ENCOUNTER — Encounter (INDEPENDENT_AMBULATORY_CARE_PROVIDER_SITE_OTHER): Payer: Self-pay

## 2018-09-11 ENCOUNTER — Ambulatory Visit (INDEPENDENT_AMBULATORY_CARE_PROVIDER_SITE_OTHER): Payer: Medicare Other | Admitting: Physician Assistant

## 2018-09-11 ENCOUNTER — Encounter: Payer: Self-pay | Admitting: Physician Assistant

## 2018-09-11 VITALS — BP 161/92 | HR 80 | Ht 70.0 in | Wt 218.0 lb

## 2018-09-11 DIAGNOSIS — N182 Chronic kidney disease, stage 2 (mild): Secondary | ICD-10-CM | POA: Diagnosis not present

## 2018-09-11 DIAGNOSIS — E785 Hyperlipidemia, unspecified: Secondary | ICD-10-CM | POA: Diagnosis not present

## 2018-09-11 DIAGNOSIS — I5032 Chronic diastolic (congestive) heart failure: Secondary | ICD-10-CM

## 2018-09-11 DIAGNOSIS — I4819 Other persistent atrial fibrillation: Secondary | ICD-10-CM

## 2018-09-11 DIAGNOSIS — I10 Essential (primary) hypertension: Secondary | ICD-10-CM

## 2018-09-11 MED ORDER — METOPROLOL SUCCINATE ER 50 MG PO TB24: 50.0000 mg | ORAL_TABLET | Freq: Every day | ORAL | 1 refills | Status: DC

## 2018-09-11 MED ORDER — POTASSIUM CHLORIDE CRYS ER 20 MEQ PO TBCR: 20.0000 meq | EXTENDED_RELEASE_TABLET | Freq: Every day | ORAL | 1 refills | Status: DC

## 2018-09-11 MED ORDER — FUROSEMIDE 40 MG PO TABS: 40.0000 mg | ORAL_TABLET | Freq: Every day | ORAL | 1 refills | Status: DC

## 2018-09-11 NOTE — Patient Instructions (Signed)
Medication Instructions:  Your physician has recommended you make the following change in your medication:  1) REDUCE Lasix to 40mg  daily 2) REDUCE Potassium to 55mEq daily 3) INCREASE Metoprolol to 50mg  daily 4) STOP Lisinopril  If you need a refill on your cardiac medications before your next appointment, please call your pharmacy.   Lab work: Health visitor and Lipid today If you have labs (blood work) drawn today and your tests are completely normal, you will receive your results only by: Marland Kitchen MyChart Message (if you have MyChart) OR . A paper copy in the mail If you have any lab test that is abnormal or we need to change your treatment, we will call you to review the results.  Testing/Procedures: None ordered  Follow-Up: At [facility], you and your health needs are our priority.  As part of our continuing mission to provide you with exceptional heart care, we have created designated Provider Care Teams.  These Care Teams include your primary Cardiologist (physician) and Advanced Practice Providers (APPs -  Physician Assistants and Nurse Practitioners) who all work together to provide you with the care you need, when you need it. You will need a follow up appointment in 2 months.    You may see Sanda Klein, MD or Rosaria Ferries, PA  Any Other Special Instructions Will Be Listed Below (If Applicable).
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
## 2018-09-12 LAB — LIPID PANEL
Chol/HDL Ratio: 4.6 ratio (ref 0.0–5.0)
Cholesterol, Total: 176 mg/dL (ref 100–199)
HDL: 38 mg/dL — ABNORMAL LOW (ref >39)
LDL Calculated: 94 mg/dL (ref 0–99)
Triglycerides: 222 mg/dL — ABNORMAL HIGH (ref 0–149)
VLDL Cholesterol Cal: 44 mg/dL — ABNORMAL HIGH (ref 5–40)

## 2018-09-12 LAB — COMPREHENSIVE METABOLIC PANEL
ALT: 12 IU/L (ref 0–44)
AST: 10 IU/L (ref 0–40)
Albumin/Globulin Ratio: 1.8 (ref 1.2–2.2)
Albumin: 4.2 g/dL (ref 3.5–4.8)
Alkaline Phosphatase: 103 IU/L (ref 39–117)
BUN/Creatinine Ratio: 16 (ref 10–24)
BUN: 31 mg/dL — AB (ref 8–27)
Bilirubin Total: 0.5 mg/dL (ref 0.0–1.2)
CO2: 23 mmol/L (ref 20–29)
CREATININE: 1.91 mg/dL — AB (ref 0.76–1.27)
Calcium: 9 mg/dL (ref 8.6–10.2)
Chloride: 101 mmol/L (ref 96–106)
GFR calc Af Amer: 38 mL/min/{1.73_m2} — ABNORMAL LOW (ref >59)
GFR, EST NON AFRICAN AMERICAN: 33 mL/min/{1.73_m2} — AB (ref >59)
GLUCOSE: 248 mg/dL — AB (ref 65–99)
Globulin, Total: 2.4 g/dL (ref 1.5–4.5)
Potassium: 4.1 mmol/L (ref 3.5–5.2)
Sodium: 142 mmol/L (ref 134–144)
Total Protein: 6.6 g/dL (ref 6.0–8.5)

## 2018-09-12 NOTE — Progress Notes (Signed)
Thanks, [employer]

## 2018-09-18 ENCOUNTER — Telehealth: Payer: Self-pay

## 2018-09-18 NOTE — Telephone Encounter (Signed)
Followed with pt on rx management referral. Pt declined any need for assistance at this time.

## 2018-09-20 NOTE — Progress Notes (Signed)
The patient has been notified of the result and verbalized understanding.  All questions (if any) were answered.     

## 2018-12-04 ENCOUNTER — Other Ambulatory Visit: Payer: Self-pay | Admitting: Physician Assistant

## 2018-12-12 ENCOUNTER — Telehealth: Payer: Self-pay | Admitting: Physician Assistant

## 2018-12-12 NOTE — Telephone Encounter (Signed)
LMTCB to arrange virtual visit and do visit pre-call for appt with Rosaria Ferries, PA on 12/14/18 at 10 AM. Attempted to call home phone; phone rang and then hung up. Attempted to call cell which went straight to vm. (ok per DPR to leave detailed message on cell).

## 2018-12-13 NOTE — Telephone Encounter (Signed)
TELEPHONE CALL NOTE  Micheal Holt has been deemed a candidate for a follow-up tele-health visit to limit community exposure during the Covid-19 pandemic. I spoke with the patient via phone to ensure availability of phone/video source, confirm preferred email & phone number, and discuss instructions and expectations.  I reminded Micheal Holt to be prepared with any vital sign and/or heart rhythm information that could potentially be obtained via home monitoring, at the time of his visit. I reminded Micheal Holt to expect a phone call at the time of his visit if his visit.  Did the patient verbally acknowledge consent to treatment? Yes  Therisa Doyne 12/13/2018 1:51 PM  Pt stated he does not have BP cuff at home, but was at the doctor recently and can give that BP. He does have a weight scale at home. Pt does not have email or smartphone so is willing to do telephone visit.  CONSENT FOR TELE-HEALTH VISIT - PLEASE REVIEW  I hereby voluntarily request, consent and authorize [facility] and its employed or contracted physicians, physician assistants, nurse practitioners or other licensed health care professionals (the Practitioner), to provide me with telemedicine health care services (the "Services") as deemed necessary by the treating Practitioner. I acknowledge and consent to receive the Services by the Practitioner via telemedicine. I understand that the telemedicine visit will involve communicating with the Practitioner through live audiovisual communication technology and the disclosure of certain medical information by electronic transmission. I acknowledge that I have been given the opportunity to request an in-person assessment or other available alternative prior to the telemedicine visit and am voluntarily participating in the telemedicine visit.  I understand that I have the right to withhold or withdraw my consent to the use of telemedicine in the course of my care at any time,  without affecting my right to future care or treatment, and that the Practitioner or I may terminate the telemedicine visit at any time. I understand that I have the right to inspect all information obtained and/or recorded in the course of the telemedicine visit and may receive copies of available information for a reasonable fee.  I understand that some of the potential risks of receiving the Services via telemedicine include:  Marland Kitchen Delay or interruption in medical evaluation due to technological equipment failure or disruption; . Information transmitted may not be sufficient (e.g. poor resolution of images) to allow for appropriate medical decision making by the Practitioner; and/or  . In rare instances, security protocols could fail, causing a breach of personal health information.  Furthermore, I acknowledge that it is my responsibility to provide information about my medical history, conditions and care that is complete and accurate to the best of my ability. I acknowledge that Practitioner's advice, recommendations, and/or decision may be based on factors not within their control, such as incomplete or inaccurate data provided by me or distortions of diagnostic images or specimens that may result from electronic transmissions. I understand that the practice of medicine is not an exact science and that Practitioner makes no warranties or guarantees regarding treatment outcomes. I acknowledge that I will receive a copy of this consent concurrently upon execution via email to the email address I last provided but may also request a printed copy by calling the office of [place].    I understand that my insurance will be billed for this visit.   I have read or had this consent read to me. . I understand the contents of this  consent, which adequately explains the benefits and risks of the Services being provided via telemedicine.  . I have been provided ample opportunity to ask questions regarding  this consent and the Services and have had my questions answered to my satisfaction. . I give my informed consent for the services to be provided through the use of telemedicine in my medical care  By participating in this telemedicine visit I agree to the above.
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
## 2018-12-14 ENCOUNTER — Telehealth (INDEPENDENT_AMBULATORY_CARE_PROVIDER_SITE_OTHER): Payer: Medicare Other | Admitting: Physician Assistant

## 2018-12-14 VITALS — Ht 70.0 in | Wt 209.0 lb

## 2018-12-14 DIAGNOSIS — N183 Chronic kidney disease, stage 3 unspecified: Secondary | ICD-10-CM

## 2018-12-14 DIAGNOSIS — Z7901 Long term (current) use of anticoagulants: Secondary | ICD-10-CM

## 2018-12-14 DIAGNOSIS — I5042 Chronic combined systolic (congestive) and diastolic (congestive) heart failure: Secondary | ICD-10-CM

## 2018-12-14 DIAGNOSIS — I482 Chronic atrial fibrillation, unspecified: Secondary | ICD-10-CM

## 2018-12-14 NOTE — Patient Instructions (Signed)
Medication Instructions:  Rosaria Ferries, PA-C has recommended making the following medication changes: 1. INCREASE Metoprolol to 50 mg daily 2. DECREASE Furosemide (Lasix) to 1 tablet daily. May take an extra tablet as needed for swelling. 3. DECREASE Potassium to 1 tablet daily  If you need a refill on your cardiac medications before your next appointment, please call your pharmacy.   Lab work: Your physician recommends that you return for lab work at your convenience.  If you have labs (blood work) drawn today and your tests are completely normal, you will receive your results only by: Marland Kitchen MyChart Message (if you have MyChart) OR . A paper copy in the mail If you have any lab test that is abnormal or we need to change your treatment, we will call you to review the results.  Follow-Up: Your physician recommends that you schedule a follow-up appointment in: July with Dr Sallyanne Kuster

## 2018-12-14 NOTE — Progress Notes (Signed)
Thanks. Agree once daily Xarelto is probably the best choice.  MCr

## 2018-12-18 ENCOUNTER — Other Ambulatory Visit: Payer: Self-pay | Admitting: Physician Assistant

## 2018-12-19 ENCOUNTER — Other Ambulatory Visit: Payer: Self-pay | Admitting: Pharmacist Clinician (PhC)/ Clinical Pharmacy Specialist

## 2018-12-19 MED ORDER — RIVAROXABAN 15 MG PO TABS: 15.0000 mg | ORAL_TABLET | Freq: Every day | ORAL | 5 refills | Status: DC

## 2018-12-30 ENCOUNTER — Emergency Department (HOSPITAL_COMMUNITY): Payer: Medicare Other

## 2018-12-30 ENCOUNTER — Inpatient Hospital Stay (HOSPITAL_COMMUNITY)
Admission: EM | Admit: 2018-12-30 | Discharge: 2019-01-04 | DRG: 064 | Disposition: A | Discharge status: Hospice | Payer: Medicare Other | Attending: Family Medicine | Admitting: Family Medicine

## 2018-12-30 ENCOUNTER — Encounter (HOSPITAL_COMMUNITY): Payer: Self-pay | Admitting: Emergency Medicine

## 2018-12-30 DIAGNOSIS — Z7901 Long term (current) use of anticoagulants: Secondary | ICD-10-CM

## 2018-12-30 DIAGNOSIS — I130 Hypertensive heart and chronic kidney disease with heart failure and stage 1 through stage 4 chronic kidney disease, or unspecified chronic kidney disease: Secondary | ICD-10-CM | POA: Diagnosis present

## 2018-12-30 DIAGNOSIS — I63411 Cerebral infarction due to embolism of right middle cerebral artery: Secondary | ICD-10-CM | POA: Diagnosis not present

## 2018-12-30 DIAGNOSIS — Z7189 Other specified counseling: Secondary | ICD-10-CM

## 2018-12-30 DIAGNOSIS — I4891 Unspecified atrial fibrillation: Secondary | ICD-10-CM

## 2018-12-30 DIAGNOSIS — I639 Cerebral infarction, unspecified: Secondary | ICD-10-CM | POA: Diagnosis present

## 2018-12-30 DIAGNOSIS — Y69 Unspecified misadventure during surgical and medical care: Secondary | ICD-10-CM | POA: Diagnosis present

## 2018-12-30 DIAGNOSIS — Z833 Family history of diabetes mellitus: Secondary | ICD-10-CM

## 2018-12-30 DIAGNOSIS — Y92009 Unspecified place in unspecified non-institutional (private) residence as the place of occurrence of the external cause: Secondary | ICD-10-CM

## 2018-12-30 DIAGNOSIS — Z85828 Personal history of other malignant neoplasm of skin: Secondary | ICD-10-CM

## 2018-12-30 DIAGNOSIS — E876 Hypokalemia: Secondary | ICD-10-CM | POA: Diagnosis present

## 2018-12-30 DIAGNOSIS — E785 Hyperlipidemia, unspecified: Secondary | ICD-10-CM | POA: Diagnosis present

## 2018-12-30 DIAGNOSIS — R0902 Hypoxemia: Secondary | ICD-10-CM | POA: Diagnosis not present

## 2018-12-30 DIAGNOSIS — N183 Chronic kidney disease, stage 3 (moderate): Secondary | ICD-10-CM | POA: Diagnosis present

## 2018-12-30 DIAGNOSIS — R9401 Abnormal electroencephalogram [EEG]: Secondary | ICD-10-CM

## 2018-12-30 DIAGNOSIS — G4733 Obstructive sleep apnea (adult) (pediatric): Secondary | ICD-10-CM | POA: Diagnosis present

## 2018-12-30 DIAGNOSIS — Z66 Do not resuscitate: Secondary | ICD-10-CM | POA: Diagnosis present

## 2018-12-30 DIAGNOSIS — R29724 NIHSS score 24: Secondary | ICD-10-CM | POA: Diagnosis not present

## 2018-12-30 DIAGNOSIS — I63311 Cerebral infarction due to thrombosis of right middle cerebral artery: Secondary | ICD-10-CM | POA: Diagnosis not present

## 2018-12-30 DIAGNOSIS — N390 Urinary tract infection, site not specified: Secondary | ICD-10-CM | POA: Diagnosis present

## 2018-12-30 DIAGNOSIS — S8011XA Contusion of right lower leg, initial encounter: Secondary | ICD-10-CM | POA: Diagnosis present

## 2018-12-30 DIAGNOSIS — Z9119 Patient's noncompliance with other medical treatment and regimen: Secondary | ICD-10-CM

## 2018-12-30 DIAGNOSIS — R29723 NIHSS score 23: Secondary | ICD-10-CM | POA: Diagnosis not present

## 2018-12-30 DIAGNOSIS — I63413 Cerebral infarction due to embolism of bilateral middle cerebral arteries: Principal | ICD-10-CM | POA: Diagnosis present

## 2018-12-30 DIAGNOSIS — Z6829 Body mass index (BMI) 29.0-29.9, adult: Secondary | ICD-10-CM

## 2018-12-30 DIAGNOSIS — E1165 Type 2 diabetes mellitus with hyperglycemia: Secondary | ICD-10-CM | POA: Diagnosis present

## 2018-12-30 DIAGNOSIS — S8012XA Contusion of left lower leg, initial encounter: Secondary | ICD-10-CM | POA: Diagnosis present

## 2018-12-30 DIAGNOSIS — R1314 Dysphagia, pharyngoesophageal phase: Secondary | ICD-10-CM | POA: Diagnosis present

## 2018-12-30 DIAGNOSIS — R29725 NIHSS score 25: Secondary | ICD-10-CM | POA: Diagnosis not present

## 2018-12-30 DIAGNOSIS — N184 Chronic kidney disease, stage 4 (severe): Secondary | ICD-10-CM | POA: Diagnosis not present

## 2018-12-30 DIAGNOSIS — R197 Diarrhea, unspecified: Secondary | ICD-10-CM | POA: Diagnosis present

## 2018-12-30 DIAGNOSIS — IMO0002 Reserved for concepts with insufficient information to code with codable children: Secondary | ICD-10-CM | POA: Diagnosis present

## 2018-12-30 DIAGNOSIS — Z20828 Contact with and (suspected) exposure to other viral communicable diseases: Secondary | ICD-10-CM | POA: Diagnosis present

## 2018-12-30 DIAGNOSIS — E1122 Type 2 diabetes mellitus with diabetic chronic kidney disease: Secondary | ICD-10-CM | POA: Diagnosis present

## 2018-12-30 DIAGNOSIS — Z86010 Personal history of colonic polyps: Secondary | ICD-10-CM

## 2018-12-30 DIAGNOSIS — I10 Essential (primary) hypertension: Secondary | ICD-10-CM

## 2018-12-30 DIAGNOSIS — Z79899 Other long term (current) drug therapy: Secondary | ICD-10-CM

## 2018-12-30 DIAGNOSIS — G8194 Hemiplegia, unspecified affecting left nondominant side: Secondary | ICD-10-CM | POA: Diagnosis present

## 2018-12-30 DIAGNOSIS — N400 Enlarged prostate without lower urinary tract symptoms: Secondary | ICD-10-CM | POA: Diagnosis present

## 2018-12-30 DIAGNOSIS — Z8249 Family history of ischemic heart disease and other diseases of the circulatory system: Secondary | ICD-10-CM

## 2018-12-30 DIAGNOSIS — E872 Acidosis: Secondary | ICD-10-CM | POA: Diagnosis present

## 2018-12-30 DIAGNOSIS — I482 Chronic atrial fibrillation, unspecified: Secondary | ICD-10-CM | POA: Diagnosis present

## 2018-12-30 DIAGNOSIS — Z8673 Personal history of transient ischemic attack (TIA), and cerebral infarction without residual deficits: Secondary | ICD-10-CM

## 2018-12-30 DIAGNOSIS — I5043 Acute on chronic combined systolic (congestive) and diastolic (congestive) heart failure: Secondary | ICD-10-CM | POA: Diagnosis present

## 2018-12-30 DIAGNOSIS — W19XXXA Unspecified fall, initial encounter: Secondary | ICD-10-CM | POA: Diagnosis present

## 2018-12-30 DIAGNOSIS — R29722 NIHSS score 22: Secondary | ICD-10-CM | POA: Diagnosis present

## 2018-12-30 DIAGNOSIS — Z515 Encounter for palliative care: Secondary | ICD-10-CM | POA: Diagnosis not present

## 2018-12-30 DIAGNOSIS — H510 Palsy (spasm) of conjugate gaze: Secondary | ICD-10-CM | POA: Diagnosis present

## 2018-12-30 DIAGNOSIS — I4821 Permanent atrial fibrillation: Secondary | ICD-10-CM | POA: Diagnosis not present

## 2018-12-30 DIAGNOSIS — R32 Unspecified urinary incontinence: Secondary | ICD-10-CM | POA: Diagnosis present

## 2018-12-30 DIAGNOSIS — Z8371 Family history of colonic polyps: Secondary | ICD-10-CM

## 2018-12-30 DIAGNOSIS — R29810 Facial weakness: Secondary | ICD-10-CM | POA: Diagnosis present

## 2018-12-30 DIAGNOSIS — R471 Dysarthria and anarthria: Secondary | ICD-10-CM | POA: Diagnosis present

## 2018-12-30 DIAGNOSIS — S40021A Contusion of right upper arm, initial encounter: Secondary | ICD-10-CM | POA: Diagnosis present

## 2018-12-30 DIAGNOSIS — Z7902 Long term (current) use of antithrombotics/antiplatelets: Secondary | ICD-10-CM

## 2018-12-30 DIAGNOSIS — R4702 Dysphasia: Secondary | ICD-10-CM | POA: Diagnosis present

## 2018-12-30 DIAGNOSIS — I501 Left ventricular failure: Secondary | ICD-10-CM | POA: Diagnosis present

## 2018-12-30 DIAGNOSIS — S0083XA Contusion of other part of head, initial encounter: Secondary | ICD-10-CM | POA: Diagnosis present

## 2018-12-30 DIAGNOSIS — Z7984 Long term (current) use of oral hypoglycemic drugs: Secondary | ICD-10-CM

## 2018-12-30 DIAGNOSIS — I158 Other secondary hypertension: Secondary | ICD-10-CM

## 2018-12-30 DIAGNOSIS — I63019 Cerebral infarction due to thrombosis of unspecified vertebral artery: Secondary | ICD-10-CM | POA: Diagnosis not present

## 2018-12-30 DIAGNOSIS — Z823 Family history of stroke: Secondary | ICD-10-CM

## 2018-12-30 DIAGNOSIS — R159 Full incontinence of feces: Secondary | ICD-10-CM | POA: Diagnosis present

## 2018-12-30 DIAGNOSIS — R9431 Abnormal electrocardiogram [ECG] [EKG]: Secondary | ICD-10-CM | POA: Diagnosis not present

## 2018-12-30 DIAGNOSIS — S40022A Contusion of left upper arm, initial encounter: Secondary | ICD-10-CM | POA: Diagnosis present

## 2018-12-30 HISTORY — DX: Unspecified atrial fibrillation: I48.91

## 2018-12-30 LAB — COMPREHENSIVE METABOLIC PANEL
ALT: 12 U/L (ref 0–44)
AST: 21 U/L (ref 15–41)
Albumin: 3.5 g/dL (ref 3.5–5.0)
Alkaline Phosphatase: 89 U/L (ref 38–126)
Anion gap: 16 — ABNORMAL HIGH (ref 5–15)
BUN: 19 mg/dL (ref 8–23)
CO2: 17 mmol/L — ABNORMAL LOW (ref 22–32)
Calcium: 8.9 mg/dL (ref 8.9–10.3)
Chloride: 105 mmol/L (ref 98–111)
Creatinine, Ser: 1.9 mg/dL — ABNORMAL HIGH (ref 0.61–1.24)
GFR calc Af Amer: 38 mL/min — ABNORMAL LOW (ref >60)
GFR calc non Af Amer: 33 mL/min — ABNORMAL LOW (ref >60)
Glucose, Bld: 242 mg/dL — ABNORMAL HIGH (ref 70–99)
Potassium: 3.8 mmol/L (ref 3.5–5.1)
Sodium: 138 mmol/L (ref 135–145)
Total Bilirubin: 1.4 mg/dL — ABNORMAL HIGH (ref 0.3–1.2)
Total Protein: 6.4 g/dL — ABNORMAL LOW (ref 6.5–8.1)

## 2018-12-30 LAB — APTT: aPTT: 28 seconds (ref 24–36)

## 2018-12-30 LAB — URINALYSIS, ROUTINE W REFLEX MICROSCOPIC
Bilirubin Urine: NEGATIVE
Glucose, UA: >=500 mg/dL — AB
Ketones, ur: 20 mg/dL — AB
Leukocytes,Ua: NEGATIVE
Nitrite: POSITIVE — AB
Protein, ur: >=300 mg/dL — AB
RBC / HPF: >50 RBC/hpf — ABNORMAL HIGH (ref 0–5)
Specific Gravity, Urine: 1.026 (ref 1.005–1.030)
pH: 5 (ref 5.0–8.0)

## 2018-12-30 LAB — CBC
HCT: 49.6 % (ref 39.0–52.0)
Hemoglobin: 16.3 g/dL (ref 13.0–17.0)
MCH: 28.1 pg (ref 26.0–34.0)
MCHC: 32.9 g/dL (ref 30.0–36.0)
MCV: 85.5 fL (ref 80.0–100.0)
Platelets: 305 10*3/uL (ref 150–400)
RBC: 5.8 MIL/uL (ref 4.22–5.81)
RDW: 13.2 % (ref 11.5–15.5)
WBC: 15.7 10*3/uL — ABNORMAL HIGH (ref 4.0–10.5)
nRBC: 0 % (ref 0.0–0.2)

## 2018-12-30 LAB — SARS CORONAVIRUS 2 BY RT PCR (HOSPITAL ORDER, PERFORMED IN ~~LOC~~ HOSPITAL LAB): SARS Coronavirus 2: NEGATIVE

## 2018-12-30 LAB — PROTIME-INR
INR: 1.2 (ref 0.8–1.2)
Prothrombin Time: 14.7 seconds (ref 11.4–15.2)

## 2018-12-30 LAB — LACTIC ACID, PLASMA
Lactic Acid, Venous: 2.8 mmol/L (ref 0.5–1.9)
Lactic Acid, Venous: 2.5 mmol/L (ref 0.5–1.9)

## 2018-12-30 LAB — BRAIN NATRIURETIC PEPTIDE: B Natriuretic Peptide: 1525.9 pg/mL — ABNORMAL HIGH (ref 0.0–100.0)

## 2018-12-30 LAB — CK: Total CK: 393 U/L (ref 49–397)

## 2018-12-30 MED ORDER — SODIUM CHLORIDE 0.9 % IV SOLN: INTRAVENOUS | Status: DC

## 2018-12-30 MED ORDER — ONDANSETRON HCL 4 MG/2ML IJ SOLN
INTRAMUSCULAR | Status: AC
  Administered 2018-12-30: 21:00:00 4 mg via INTRAVENOUS
  Filled 2018-12-30: qty 2

## 2018-12-30 MED ORDER — ASPIRIN 325 MG PO TABS: 325.0000 mg | ORAL_TABLET | Freq: Every day | ORAL | Status: DC

## 2018-12-30 MED ORDER — ACETAMINOPHEN 650 MG RE SUPP: 650.0000 mg | RECTAL | Status: DC | PRN

## 2018-12-30 MED ORDER — ONDANSETRON HCL 4 MG/2ML IJ SOLN
4.0000 mg | Freq: Once | INTRAMUSCULAR | Status: AC
  Administered 2018-12-30: 21:00:00 4 mg via INTRAVENOUS

## 2018-12-30 MED ORDER — ENOXAPARIN SODIUM 40 MG/0.4ML ~~LOC~~ SOLN: 40.0000 mg | Freq: Every day | SUBCUTANEOUS | Status: DC

## 2018-12-30 MED ORDER — LABETALOL HCL 5 MG/ML IV SOLN
5.0000 mg | Freq: Once | INTRAVENOUS | Status: AC
  Administered 2018-12-30: 23:00:00 5 mg via INTRAVENOUS
  Filled 2018-12-30: qty 4

## 2018-12-30 MED ORDER — ACETAMINOPHEN 325 MG PO TABS: 650.0000 mg | ORAL_TABLET | ORAL | Status: DC | PRN

## 2018-12-30 MED ORDER — SODIUM CHLORIDE 0.9 % IV SOLN
1.0000 g | Freq: Once | INTRAVENOUS | Status: AC
  Administered 2018-12-30: 1 g via INTRAVENOUS
  Filled 2018-12-30: qty 10

## 2018-12-30 MED ORDER — SODIUM CHLORIDE 0.9 % IV BOLUS
1000.0000 mL | Freq: Once | INTRAVENOUS | Status: AC
  Administered 2018-12-30: 23:00:00 1000 mL via INTRAVENOUS

## 2018-12-30 MED ORDER — STROKE: EARLY STAGES OF RECOVERY BOOK
Freq: Once | Status: AC
  Administered 2018-12-31: 01:00:00

## 2018-12-30 MED ORDER — ACETAMINOPHEN 160 MG/5ML PO SOLN: 650.0000 mg | ORAL | Status: DC | PRN
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
## 2018-12-30 NOTE — ED Notes (Signed)
Notified PA Santiago Glad of BP of 230/130, no new orders at this time
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
## 2018-12-30 NOTE — ED Notes (Signed)
Son, Micheal Holt, would like an update when possible at [phone]
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
## 2018-12-31 ENCOUNTER — Inpatient Hospital Stay (HOSPITAL_COMMUNITY): Payer: Medicare Other

## 2018-12-31 DIAGNOSIS — I10 Essential (primary) hypertension: Secondary | ICD-10-CM

## 2018-12-31 DIAGNOSIS — I639 Cerebral infarction, unspecified: Secondary | ICD-10-CM

## 2018-12-31 DIAGNOSIS — I4821 Permanent atrial fibrillation: Secondary | ICD-10-CM

## 2018-12-31 DIAGNOSIS — R0902 Hypoxemia: Secondary | ICD-10-CM | POA: Diagnosis present

## 2018-12-31 DIAGNOSIS — N184 Chronic kidney disease, stage 4 (severe): Secondary | ICD-10-CM

## 2018-12-31 DIAGNOSIS — G8194 Hemiplegia, unspecified affecting left nondominant side: Secondary | ICD-10-CM

## 2018-12-31 DIAGNOSIS — G4733 Obstructive sleep apnea (adult) (pediatric): Secondary | ICD-10-CM

## 2018-12-31 DIAGNOSIS — Y69 Unspecified misadventure during surgical and medical care: Secondary | ICD-10-CM

## 2018-12-31 DIAGNOSIS — I501 Left ventricular failure: Secondary | ICD-10-CM

## 2018-12-31 DIAGNOSIS — E1165 Type 2 diabetes mellitus with hyperglycemia: Secondary | ICD-10-CM

## 2018-12-31 DIAGNOSIS — R9431 Abnormal electrocardiogram [ECG] [EKG]: Secondary | ICD-10-CM

## 2018-12-31 LAB — GLUCOSE, CAPILLARY
Glucose-Capillary: 117 mg/dL — ABNORMAL HIGH (ref 70–99)
Glucose-Capillary: 148 mg/dL — ABNORMAL HIGH (ref 70–99)
Glucose-Capillary: 206 mg/dL — ABNORMAL HIGH (ref 70–99)
Glucose-Capillary: 224 mg/dL — ABNORMAL HIGH (ref 70–99)

## 2018-12-31 LAB — CBC
HCT: 46.6 % (ref 39.0–52.0)
Hemoglobin: 15.2 g/dL (ref 13.0–17.0)
MCH: 28.3 pg (ref 26.0–34.0)
MCHC: 32.6 g/dL (ref 30.0–36.0)
MCV: 86.8 fL (ref 80.0–100.0)
Platelets: 116 10*3/uL — ABNORMAL LOW (ref 150–400)
RBC: 5.37 MIL/uL (ref 4.22–5.81)
RDW: 13.6 % (ref 11.5–15.5)
WBC: 12.5 10*3/uL — ABNORMAL HIGH (ref 4.0–10.5)
nRBC: 0 % (ref 0.0–0.2)

## 2018-12-31 LAB — ECHOCARDIOGRAM LIMITED
Height: 70 in
Weight: 3227.53 oz

## 2018-12-31 LAB — HEMOGLOBIN A1C
Hgb A1c MFr Bld: 7.1 % — ABNORMAL HIGH (ref 4.8–5.6)
Mean Plasma Glucose: 157.07 mg/dL

## 2018-12-31 LAB — BLOOD GAS, ARTERIAL
Acid-base deficit: 1.7 mmol/L (ref 0.0–2.0)
Bicarbonate: 22.1 mmol/L (ref 20.0–28.0)
Delivery systems: POSITIVE
Drawn by: 55062
Expiratory PAP: 7
FIO2: 30
Inspiratory PAP: 14
O2 Saturation: 97.5 %
Patient temperature: 98.6
pCO2 arterial: 35 mmHg (ref 32.0–48.0)
pH, Arterial: 7.417 (ref 7.350–7.450)
pO2, Arterial: 99.3 mmHg (ref 83.0–108.0)

## 2018-12-31 LAB — BASIC METABOLIC PANEL
Anion gap: 17 — ABNORMAL HIGH (ref 5–15)
BUN: 21 mg/dL (ref 8–23)
CO2: 16 mmol/L — ABNORMAL LOW (ref 22–32)
Calcium: 8.8 mg/dL — ABNORMAL LOW (ref 8.9–10.3)
Chloride: 107 mmol/L (ref 98–111)
Creatinine, Ser: 2.03 mg/dL — ABNORMAL HIGH (ref 0.61–1.24)
GFR calc Af Amer: 35 mL/min — ABNORMAL LOW (ref >60)
GFR calc non Af Amer: 30 mL/min — ABNORMAL LOW (ref >60)
Glucose, Bld: 244 mg/dL — ABNORMAL HIGH (ref 70–99)
Potassium: 4.3 mmol/L (ref 3.5–5.1)
Sodium: 140 mmol/L (ref 135–145)

## 2018-12-31 LAB — LIPID PANEL
Cholesterol: 152 mg/dL (ref 0–200)
HDL: 48 mg/dL (ref >40)
LDL Cholesterol: 93 mg/dL (ref 0–99)
Total CHOL/HDL Ratio: 3.2 RATIO
Triglycerides: 55 mg/dL (ref <150)
VLDL: 11 mg/dL (ref 0–40)

## 2018-12-31 LAB — LACTIC ACID, PLASMA: Lactic Acid, Venous: 3.1 mmol/L (ref 0.5–1.9)

## 2018-12-31 MED ORDER — ASPIRIN 325 MG PO TABS
325.0000 mg | ORAL_TABLET | Freq: Every day | ORAL | Status: DC
  Filled 2018-12-31: qty 1

## 2018-12-31 MED ORDER — METOPROLOL TARTRATE 5 MG/5ML IV SOLN
10.0000 mg | Freq: Four times a day (QID) | INTRAVENOUS | Status: DC
  Administered 2018-12-31 – 2019-01-01 (×2): 10 mg via INTRAVENOUS
  Filled 2018-12-31 (×2): qty 10

## 2018-12-31 MED ORDER — ENOXAPARIN SODIUM 40 MG/0.4ML ~~LOC~~ SOLN
40.0000 mg | SUBCUTANEOUS | Status: DC
  Administered 2019-01-01: 08:00:00 40 mg via SUBCUTANEOUS
  Filled 2018-12-31: qty 0.4

## 2018-12-31 MED ORDER — ALBUTEROL SULFATE (2.5 MG/3ML) 0.083% IN NEBU
INHALATION_SOLUTION | RESPIRATORY_TRACT | Status: AC
  Administered 2018-12-31: 2.5 mg
  Filled 2018-12-31: qty 3

## 2018-12-31 MED ORDER — LABETALOL HCL 5 MG/ML IV SOLN
5.0000 mg | Freq: Once | INTRAVENOUS | Status: AC
  Administered 2018-12-31: 01:00:00 5 mg via INTRAVENOUS
  Filled 2018-12-31: qty 4

## 2018-12-31 MED ORDER — FUROSEMIDE 10 MG/ML IJ SOLN: 40.0000 mg | Freq: Once | INTRAMUSCULAR | Status: DC

## 2018-12-31 MED ORDER — INSULIN ASPART 100 UNIT/ML ~~LOC~~ SOLN
0.0000 [IU] | Freq: Three times a day (TID) | SUBCUTANEOUS | Status: DC
  Administered 2018-12-31: 17:00:00 1 [IU] via SUBCUTANEOUS
  Administered 2018-12-31: 13:00:00 3 [IU] via SUBCUTANEOUS
  Administered 2019-01-01 – 2019-01-03 (×4): 1 [IU] via SUBCUTANEOUS
  Administered 2019-01-03: 2 [IU] via SUBCUTANEOUS

## 2018-12-31 MED ORDER — ALBUTEROL SULFATE (2.5 MG/3ML) 0.083% IN NEBU: 2.5000 mL | INHALATION_SOLUTION | Freq: Four times a day (QID) | RESPIRATORY_TRACT | Status: DC | PRN

## 2018-12-31 MED ORDER — ASPIRIN 300 MG RE SUPP
300.0000 mg | Freq: Every day | RECTAL | Status: DC
  Administered 2018-12-31 – 2019-01-03 (×4): 300 mg via RECTAL
  Filled 2018-12-31 (×4): qty 1

## 2018-12-31 MED ORDER — LORAZEPAM 2 MG/ML IJ SOLN
1.0000 mg | Freq: Once | INTRAMUSCULAR | Status: AC | PRN
  Administered 2018-12-31: 02:00:00 1 mg via INTRAVENOUS
  Filled 2018-12-31: qty 1

## 2018-12-31 MED ORDER — ATORVASTATIN CALCIUM 40 MG PO TABS: 40.0000 mg | ORAL_TABLET | Freq: Every day | ORAL | Status: DC

## 2018-12-31 MED ORDER — METOPROLOL SUCCINATE ER 25 MG PO TB24: 25.0000 mg | ORAL_TABLET | Freq: Every day | ORAL | Status: DC

## 2018-12-31 MED ORDER — ALBUTEROL SULFATE HFA 108 (90 BASE) MCG/ACT IN AERS
1.0000 | INHALATION_SPRAY | Freq: Four times a day (QID) | RESPIRATORY_TRACT | Status: DC | PRN
  Administered 2019-01-03: 09:00:00 2 via RESPIRATORY_TRACT
  Filled 2018-12-31: qty 6.7

## 2018-12-31 MED ORDER — FUROSEMIDE 10 MG/ML IJ SOLN
40.0000 mg | Freq: Once | INTRAMUSCULAR | Status: AC
  Administered 2018-12-31: 05:00:00 40 mg via INTRAVENOUS
  Filled 2018-12-31: qty 4

## 2018-12-31 MED ORDER — FUROSEMIDE 10 MG/ML IJ SOLN
40.0000 mg | Freq: Every day | INTRAMUSCULAR | Status: DC
  Administered 2018-12-31: 40 mg via INTRAVENOUS
  Filled 2018-12-31: qty 4

## 2018-12-31 MED ORDER — PERFLUTREN LIPID MICROSPHERE
1.0000 mL | INTRAVENOUS | Status: AC | PRN
  Administered 2018-12-31: 09:00:00 2 mL via INTRAVENOUS
  Filled 2018-12-31: qty 10

## 2018-12-31 MED ORDER — LABETALOL HCL 5 MG/ML IV SOLN
5.0000 mg | INTRAVENOUS | Status: DC | PRN
  Administered 2018-12-31 (×2): 5 mg via INTRAVENOUS
  Filled 2018-12-31 (×2): qty 4

## 2018-12-31 NOTE — Progress Notes (Signed)
Received documentation of healthcare POA being his daughter, Eulogio Ditch (Previously Joni Fears), via fax.  Placed in his physical chart.    Do not believe patient has capacity at this time with lack of meaningful communication.  After discussion with Ms. Aurelio Jew on the phone, she would like her father to be DNR/DNI after previous discussions with him.   We will change CODE STATUS to DNR/DNI.  Patriciaann Clan, DO

## 2018-12-31 NOTE — Progress Notes (Signed)
PT Cancellation Note  Patient Details Name: Micheal Holt MRN: [id number] DOB: [birth year]   Cancelled Treatment:    Reason Eval/Treat Not Completed: Patient not medically ready. Pt on BiPap and not alert/very lethargic. Per RN, not following commands at this time. Acute PT to return as able to complete PT eval.  Kittie Plater, PT, DPT Acute Rehabilitation Services Pager #: [phone] Office #: [phone]    Berline Lopes 12/31/2018, 8:31 AM

## 2018-12-31 NOTE — Progress Notes (Signed)
Pt back from MRI but he wasn't corporative to perform MRI even after getting 1 dose of Ativan, will notify MD

## 2018-12-31 NOTE — Progress Notes (Signed)
Called to stat visit due to increased work of breathing in patient. Patient given albuterol nebulizer instead of MDI.

## 2018-12-31 NOTE — Progress Notes (Signed)
  Echocardiogram 2D Echocardiogram has been performed.  Very poor patient compliance. Patient trying to remove BIPAP face mask and hitting tech's arm during exam.   Richelle Glick L Androw 12/31/2018, 8:59 AM

## 2018-12-31 NOTE — Progress Notes (Signed)
Family Medicine Teaching Service Daily Progress Note Intern Pager: [phone]  Patient name: Micheal Holt Medical record number: [id number] Date of birth: June 20, 1941 Age: 78 y.o. Gender: male  Primary Care Provider: Leonard Downing, MD Consultants: Neuro Code Status: Full-to clarify today   Pt Overview and Major Events to Date:  4/26 admitted   Assessment and Plan: Micheal Tokunaga Staffordis a 78 y.o.malepresenting with weakness, fall and new CVA. PMH is significant forHFrEF,HTN,T2DM,OSA (noncompliant with CPAP), HLD, CKD III, and prior TIA  CVA,right MCA ischemic infarct: Subacute/Acute, stable. Improved muscle strength on the left, still with speech difficulty.  MRI/MRA not performed overnight d/t agitation.  A1c 7.1, lipid panel wnl.  In the setting of chronic A. fib with anticoagulation, HFrEF, HTN, DM, and previous TIA. - Neurology following, appreciate further recommendations, including appropriate anticoagulation in the setting of stroke while on Eliquis and Xarelto - Aspirin 325 mg - Obtain MRI/MRA brain today - F/U echocardiogram, carotid Dopplers - PT/OT/SLP evaluation - N.p.o. until swallow evaluation - CM, vitals per routine - Start atorvastatin  Acute on chronic HFrEF w/ EF 20-25%:Worsening.  Increasing shortness of breath, received 1 dose 40 mg of Lasix IV (0500) and started on BiPAP overnight.  Good diuresis following Lasix.  ABG this am without abnormality. Generally appears euvolemic, however BNP 1525 and CXR concerning for edema/CHF (which was obtained prior to him receiving 1L bolus in the ED).   - F/U echocardiogram - Additional Lasix 40 mg IV at 1200 - Strict I and O's, daily weights - BiPAP-titrate down as possible, patient is very frustrated and agitated with the BiPAP mask and may be contributing to presentation - Look into restarting lisinopril therapy  Arial fibrillationwithout VZD:GLOVFIE, stable.  HR 70-100.  Mali vasc 7.  Has been taking both  Eliquis and Xarelto instead of discontinuing Xarelto as instructed, and metoprolol for rate control. - Continue Lovenox prophylaxis per pharmacy recommendations while deciding on appropriate long-term anticoagulation plan - Restart home metoprolol  Hypertension: Chronic, poorly controlled.   SBP ranging 160-220s overnight.  Only taking metoprolol, previous lisinopril D/C in January for noncompliance. - Neurology recommended against permissive hypertension at this time given CHF -Restart home metoprolol as above - Will consider restarting lisinopril therapy  Lactic Acidosis: LA remaining elevated, 3.1 this a.m.  Etiology unclear, appears noninfectious and likely related to volume status with CHF.  BC no growth<12 hours.  Leukocytosis trending down.  - DC lactic acid trend - Follow BC and urine culture - Monitor CBC, watch for signs of infection - Treat CHF as above  CKD, stageIII:Chronic, stable.  Creatinine 2.03, baseline around 1.9.  - Monitor I and O's - Monitor BMP - Avoid nephrotoxic medications as possible  ?UTI: UA in ED with large hemoglobin, nitrites but was dirty catch. Patient treated with ceftriaxone x1 in ED, will not continue antibiotics at this time. -Monitor for further signs of UTI -urine culture pending  T2DM:Chronic, stable.   CBG 244 this a.m.  A1c 7.1.  Takes glipizide 5 mg, Jardiance 10 mg, and Ozempic 1 mg weekly.  - SSI - CBGs with meals and at bedtime - Consider DC of glipizide at discharge  H/o TIA (2004)  Stenosis of posterior cerebral artery: Chronic, stable. Previously on ASA and Clopidogrel, stopped when patient started Eliquis. Not on statin therapy. - Start atorvastatin  OSA: Chronic, stable.   Noncompliant with CPAP.  -CPAP nightly,patient with negative COVID-19 rapid testing in ED  PPI:RJJOACZ, stable. Lipid panel WNL.  Patient is currently  not on statin secondary to noncompliance. - Start atorvastatin  BPH: Chronic,  stable. Patient on flomax at home daily.  - Continue Flomax  CODE STATUS:  Felt to have capacity on admission and wanted full code, however as he is only able to nod his head for yes and no questions, believe communication is lacking and unable to provide capacity.  Patient's son requesting DNR/DNI with advanced directives, although they are not documented in the chart.  We will attempt to contact his son to bring in documentation for DNR/DNI. - To keep as full code at this time, likely transition to DNR/DNI  FEN/GI:NPO until passes swallow eval Prophylaxis:Lovenox  Disposition:Continue work-up  Subjective:  Interval events: Agitated pulling off his leads when going down for his MRI, MRI was not completed due to this.  Received 1 mg Ativan once back in his room.  Additionally became increasingly short of breath overnight, started on BiPAP and received Lasix 40 mg IV around 0500 with good diuresis.  Currently still on BiPAP this morning, agitated with it.  Complains of dry mouth.   Objective: Temp:  [97.7 F (36.5 C)-99.7 F (37.6 C)] 97.9 F (36.6 C) (04/26 0630) Pulse Rate:  [39-107] 100 (04/26 0543) Resp:  [18-41] 24 (04/26 0630) BP: (164-240)/(99-161) 168/135 (04/26 0630) SpO2:  [91 %-99 %] 98 % (04/26 0630) FiO2 (%):  [30 %] 30 % (04/26 0543) Weight:  [91.5 kg-94.8 kg] 91.5 kg (04/26 0452) Physical Exam: General: Older gentleman, intermittently alert, in mild distress with BiPAP on HEENT: NCAT, mucous membranes dry  Cardiac: Irregular rate and rhythm, no murmurs, gallops, or rubs Lungs: Tachypneic, with some coarse breath sounds throughout without any appreciable crackles or wheezing.  On BiPAP.  Abdomen: soft, non-distended, normoactive BS Msk: Moves all extremities spontaneously Neuro: Awake, intermittently alert.  Able to follow most commands, can answer yes or no by shaking head, no talking. 5/5 right upper and lower extremity strength.  LUE able to squeeze my hand and  LLE able to move and withdrawals leg from pain.  Sensation intact to distal extremities.  Will not open his eyes when asked. Ext: Warm, dry, 2+ distal pulses, no edema     Laboratory: Recent Labs  Lab 12/30/18 2003 12/31/18 0422  WBC 15.7* 12.5*  HGB 16.3 15.2  HCT 49.6 46.6  PLT 305 116*   Recent Labs  Lab 12/30/18 2003 12/31/18 0422  NA 138 140  K 3.8 4.3  CL 105 107  CO2 17* 16*  BUN 19 21  CREATININE 1.90* 2.03*  CALCIUM 8.9 8.8*  PROT 6.4*  --   BILITOT 1.4*  --   ALKPHOS 89  --   ALT 12  --   AST 21  --   GLUCOSE 242* 244*    Imaging/Diagnostic Tests: Ct Head Wo Contrast  Result Date: 12/30/2018 CLINICAL DATA:  Found on floor.  Facial bruising EXAM: CT HEAD WITHOUT CONTRAST CT MAXILLOFACIAL WITHOUT CONTRAST CT CERVICAL SPINE WITHOUT CONTRAST TECHNIQUE: Multidetector CT imaging of the head, cervical spine, and maxillofacial structures were performed using the standard protocol without intravenous contrast. Multiplanar CT image reconstructions of the cervical spine and maxillofacial structures were also generated. COMPARISON:  MRI head 03/16/2016, CT head 03/16/2016. FINDINGS: CT HEAD FINDINGS Brain: Moderate atrophy and ventricular enlargement similar to the prior study. New hypodensity in the right parietal lobe. Probable acute infarct. Correlate with physical exam. Chronic infarcts in the basal ganglia bilaterally. Bilateral white matter ischemia which appears chronic. Negative for hemorrhage or mass.  Vascular: Negative for hyperdense vessel Skull: Negative for skull fracture Other: None CT MAXILLOFACIAL FINDINGS Osseous: Negative for fracture in the face. Image quality degraded by motion Orbits: Bilateral cataract surgery. Negative for orbital mass or fracture. Sinuses: Mild mucosal edema paranasal sinuses.  No air-fluid level. Soft tissues: Soft tissue swelling left face and left orbit. CT CERVICAL SPINE FINDINGS Alignment: Normal Skull base and vertebrae: Negative for  fracture Soft tissues and spinal canal: Negative for soft tissue mass or edema Disc levels: Multilevel disc degeneration and prominent spurring throughout the cervical spine most severe at C4-5. Spinal stenosis at multiple levels due to spurring Upper chest: Lung apices clear bilaterally. Other: None IMPRESSION: 1. Right parietal hypodensity may represent acute infarct. MRI would be necessary to confirm. 2. Atrophy and ventricular enlargement is stable. Chronic ischemic changes are present bilaterally. No intracranial hemorrhage 3. Negative for facial fracture 4. Negative for cervical spine fracture. 5. These results were called by telephone at the time of interpretation on 12/30/2018 at 9:08 pm to [place] , who verbally acknowledged these results. Electronically Signed   By: Franchot Gallo M.D.   On: 12/30/2018 21:09   Ct Cervical Spine Wo Contrast  Result Date: 12/30/2018 CLINICAL DATA:  Found on floor.  Facial bruising EXAM: CT HEAD WITHOUT CONTRAST CT MAXILLOFACIAL WITHOUT CONTRAST CT CERVICAL SPINE WITHOUT CONTRAST TECHNIQUE: Multidetector CT imaging of the head, cervical spine, and maxillofacial structures were performed using the standard protocol without intravenous contrast. Multiplanar CT image reconstructions of the cervical spine and maxillofacial structures were also generated. COMPARISON:  MRI head 03/16/2016, CT head 03/16/2016. FINDINGS: CT HEAD FINDINGS Brain: Moderate atrophy and ventricular enlargement similar to the prior study. New hypodensity in the right parietal lobe. Probable acute infarct. Correlate with physical exam. Chronic infarcts in the basal ganglia bilaterally. Bilateral white matter ischemia which appears chronic. Negative for hemorrhage or mass. Vascular: Negative for hyperdense vessel Skull: Negative for skull fracture Other: None CT MAXILLOFACIAL FINDINGS Osseous: Negative for fracture in the face. Image quality degraded by motion Orbits: Bilateral cataract surgery.  Negative for orbital mass or fracture. Sinuses: Mild mucosal edema paranasal sinuses.  No air-fluid level. Soft tissues: Soft tissue swelling left face and left orbit. CT CERVICAL SPINE FINDINGS Alignment: Normal Skull base and vertebrae: Negative for fracture Soft tissues and spinal canal: Negative for soft tissue mass or edema Disc levels: Multilevel disc degeneration and prominent spurring throughout the cervical spine most severe at C4-5. Spinal stenosis at multiple levels due to spurring Upper chest: Lung apices clear bilaterally. Other: None IMPRESSION: 1. Right parietal hypodensity may represent acute infarct. MRI would be necessary to confirm. 2. Atrophy and ventricular enlargement is stable. Chronic ischemic changes are present bilaterally. No intracranial hemorrhage 3. Negative for facial fracture 4. Negative for cervical spine fracture. 5. These results were called by telephone at the time of interpretation on 12/30/2018 at 9:08 pm to [place] , who verbally acknowledged these results. Electronically Signed   By: Franchot Gallo M.D.   On: 12/30/2018 21:09   Dg Chest Port 1 View  Result Date: 12/30/2018 CLINICAL DATA:  Fall, altered mental status. EXAM: PORTABLE CHEST 1 VIEW COMPARISON:  10/01/2017 FINDINGS: Cardiomegaly. Diffuse interstitial prominence with perihilar and lower lobe opacities concerning for edema/CHF. Aortic atherosclerosis. No effusions. No acute bony abnormality. IMPRESSION: Interstitial and alveolar airspace opacities concerning for edema/CHF. Electronically Signed   By: Rolm Baptise M.D.   On: 12/30/2018 22:25   Ct Maxillofacial Wo Contrast  Result  Date: 12/30/2018 CLINICAL DATA:  Found on floor.  Facial bruising EXAM: CT HEAD WITHOUT CONTRAST CT MAXILLOFACIAL WITHOUT CONTRAST CT CERVICAL SPINE WITHOUT CONTRAST TECHNIQUE: Multidetector CT imaging of the head, cervical spine, and maxillofacial structures were performed using the standard protocol without intravenous  contrast. Multiplanar CT image reconstructions of the cervical spine and maxillofacial structures were also generated. COMPARISON:  MRI head 03/16/2016, CT head 03/16/2016. FINDINGS: CT HEAD FINDINGS Brain: Moderate atrophy and ventricular enlargement similar to the prior study. New hypodensity in the right parietal lobe. Probable acute infarct. Correlate with physical exam. Chronic infarcts in the basal ganglia bilaterally. Bilateral white matter ischemia which appears chronic. Negative for hemorrhage or mass. Vascular: Negative for hyperdense vessel Skull: Negative for skull fracture Other: None CT MAXILLOFACIAL FINDINGS Osseous: Negative for fracture in the face. Image quality degraded by motion Orbits: Bilateral cataract surgery. Negative for orbital mass or fracture. Sinuses: Mild mucosal edema paranasal sinuses.  No air-fluid level. Soft tissues: Soft tissue swelling left face and left orbit. CT CERVICAL SPINE FINDINGS Alignment: Normal Skull base and vertebrae: Negative for fracture Soft tissues and spinal canal: Negative for soft tissue mass or edema Disc levels: Multilevel disc degeneration and prominent spurring throughout the cervical spine most severe at C4-5. Spinal stenosis at multiple levels due to spurring Upper chest: Lung apices clear bilaterally. Other: None IMPRESSION: 1. Right parietal hypodensity may represent acute infarct. MRI would be necessary to confirm. 2. Atrophy and ventricular enlargement is stable. Chronic ischemic changes are present bilaterally. No intracranial hemorrhage 3. Negative for facial fracture 4. Negative for cervical spine fracture. 5. These results were called by telephone at the time of interpretation on 12/30/2018 at 9:08 pm to [place] , who verbally acknowledged these results. Electronically Signed   By: Franchot Gallo M.D.   On: 12/30/2018 21:09    Patriciaann Clan, DO 12/31/2018, 7:01 AM PGY-1, [place] Intern pager: [phone],  text pages welcome

## 2018-12-31 NOTE — Progress Notes (Signed)
Patient taken off Bipap and put on room air around 1300. Patient's oxygen staying around 89-95% O2. Put patient on nasal canula with 2 liters of oxygen. Patient's O2 staying around 92-98. Patient appears to have sleep apnea and moans.

## 2018-12-31 NOTE — Progress Notes (Signed)
Spoke with patient's daughter, Ms. Aurelio Jew, over the phone to discuss his clinical condition and code status.  Daughter states she has documentation that she is his healthcare power of attorney, after his spouse no longer has capacity d/t Alzheimer's. She additionally has code paperwork stating DNR/DNI status for the patient.  Provided her with fax # to the 3W floor to send over documentation today.  Given our direct phone number if she is unable to fax, especially given [place] environment has closed several resources/stores for fax machine access.  Patriciaann Clan, DO

## 2018-12-31 NOTE — Progress Notes (Signed)
OT Cancellation Note  Patient Details Name: Micheal Holt MRN: [id number] DOB: 08/25/1941   Cancelled Treatment:    Reason Eval/Treat Not Completed: Medical issues which prohibited therapy(Pt combative and not appropriate. OT to follow-up)   [name]) [name] OTR/L Acute Rehabilitation Services Pager: [phone] Office: [place] 12/31/2018, 1:22 PM

## 2018-12-31 NOTE — Progress Notes (Signed)
Attempted MRI 315am, patient had received dose of Ativan and was sleeping but was extremely restless.  He was trying to roll over and off our table, kept pulling off his nasal canula, was grabbing at the head coil and was overall unable to follow instructions.  Unable to do scan at this time, RN informed

## 2018-12-31 NOTE — Progress Notes (Signed)
Called [facility] to put him on their radar list at the moment, some improvement on BIPAP

## 2018-12-31 NOTE — Progress Notes (Addendum)
Received pt from the Ed, alert but confused, unable to follow commands, did not pass swallow screen in the ED, placed on Tele and verified, assessment completed, monitoring continues, pt's medications sent to pharmacy for storage.

## 2018-12-31 NOTE — Progress Notes (Signed)
Paged MD regarding pt's increased labored breathing, new order was given, his BP still high and PRN given as per order

## 2018-12-31 NOTE — Progress Notes (Signed)
SLP Cancellation Note  Patient Details Name: RAYANSH HERBST MRN: [id number] DOB: 09-26-1940   Cancelled treatment:       Reason Eval/Treat Not Completed: Medical issues which prohibited therapy(pt currently requiring BiPAP. Will continue efforts.)  Rajah Lamba B. [facility], CCC-SLP Speech Language Pathologist [phone]  Shonna Chock 12/31/2018, 9:40 AM

## 2018-12-31 NOTE — Progress Notes (Signed)
STROKE TEAM PROGRESS NOTE   HISTORY OF PRESENT ILLNESS (per record) Micheal Holt is an 78 y.o. male with chronic diastolic CHF, atrial fibrillation, prior TIA, OSA, HTN, DM, CKD and HLD who presented to the ED via EMS after he was found prone on the floor at his home.   Son monitors patient on a video camera and noted that patient was not visible, but heavy/raspy/snoring breathing was heard in the background. Son called the [facility] center after calling his sister. On arrival by EMS, the patient was under a coffee table indicative of a fall. Patient had bruising to the face, arms and legs, and was not responsive on his left side. On telephone interview, his son feels that most likely Micheal Holt has been partially compliant with his medications over the past several weeks due to nausea and diarrhea with incontinence. The patient had last talked with a family member yesterday at 73 PM.   CT Head: 1. Right parietal hypodensity may represent acute infarct. MRI would be necessary to confirm. 2. Atrophy and ventricular enlargement is stable. Chronic ischemic changes are present bilaterally. No intracranial hemorrhage 3. Negative for facial fracture 4. Negative for cervical spine fracture.  EKG:  Atrial fibrillation Non-specific intra-ventricular conduction delay Non-specific ST-t changes  CXR: Interstitial and alveolar airspace opacities concerning for edema/CHF.  Home medications list in Epic lists both Eliquis and Xarelto. The patient's son states that he recalls filling the patient's Eliquis and based on the pharmacy app for [facility] the patient's son has access to, he also has apparently recently refilled Xarelto as well.   The patient's son Micheal Holt [phone]) states that the patient's healthcare directive states no intubation or CPR should a cardiac or respiratory arrest occur.    SUBJECTIVE (INTERVAL HISTORY) He is on bipap. Nurse not at bedside. Discussed with  attending, unclear what patient's mental status is at baseline, trying to contact family for further direction. Mri not completed yet.     OBJECTIVE Vitals:   12/31/18 0805 12/31/18 0833 12/31/18 1140 12/31/18 1528  BP:  (!) 192/117 (!) 193/103 (!) 178/117  Pulse:  93 (!) 105 97  Resp:  (!) 24 (!) 36 (!) 30  Temp:  (!) 97.5 F (36.4 C) 97.9 F (36.6 C) 97.9 F (36.6 C)  TempSrc:  Axillary Axillary Axillary  SpO2: 97% 99% 98% 98%  Weight:      Height:        CBC:  Recent Labs  Lab 12/30/18 2003 12/31/18 0422  WBC 15.7* 12.5*  HGB 16.3 15.2  HCT 49.6 46.6  MCV 85.5 86.8  PLT 305 116*    Basic Metabolic Panel:  Recent Labs  Lab 12/30/18 2003 12/31/18 0422  NA 138 140  K 3.8 4.3  CL 105 107  CO2 17* 16*  GLUCOSE 242* 244*  BUN 19 21  CREATININE 1.90* 2.03*  CALCIUM 8.9 8.8*    Lipid Panel:     Component Value Date/Time   CHOL 152 12/31/2018 0422   CHOL 176 09/11/2018 1452   TRIG 55 12/31/2018 0422   HDL 48 12/31/2018 0422   HDL 38 (L) 09/11/2018 1452   CHOLHDL 3.2 12/31/2018 0422   VLDL 11 12/31/2018 0422   LDLCALC 93 12/31/2018 0422   LDLCALC 94 09/11/2018 1452   HgbA1c:  Lab Results  Component Value Date   HGBA1C 7.1 (H) 12/31/2018   Urine Drug Screen:     Component Value Date/Time   LABOPIA NONE DETECTED 03/16/2016 1650  COCAINSCRNUR NONE DETECTED 03/16/2016 1650   LABBENZ NONE DETECTED 03/16/2016 1650   AMPHETMU NONE DETECTED 03/16/2016 1650   THCU NONE DETECTED 03/16/2016 1650   LABBARB NONE DETECTED 03/16/2016 1650    Alcohol Level     Component Value Date/Time   ETH <10 07/05/2017 1149    IMAGING  Ct Head Wo Contrast Ct Cervical Spine Wo Contrast Ct Maxillofacial Wo Contrast 12/30/2018 IMPRESSION:  1. Right parietal hypodensity may represent acute infarct. MRI would be necessary to confirm.  2. Atrophy and ventricular enlargement is stable. Chronic ischemic changes are present bilaterally. No intracranial hemorrhage  3.  Negative for facial fracture  4. Negative for cervical spine fracture.    MRI / MRA Brain - pending   Dg Chest Port 1 View 12/30/2018 IMPRESSION: Interstitial and alveolar airspace opacities concerning for edema/CHF.    Transthoracic Echocardiogram  00/00/2020 Pending   Bilateral Carotid Dopplers  00/00/2020 Pending   EKG - atrial fibrillation - ventricular response 112 BPM (See cardiology reading for complete details)   PHYSICAL EXAM Blood pressure (!) 178/117, pulse 97, temperature 97.9 F (36.6 C), temperature source Axillary, resp. rate (!) 30, height 5\' 10"  (1.778 m), weight 91.5 kg, SpO2 98 %.   Gen: NAD, [place]/AT, on bipap                  Neuro:  Speech:    Cannot assess, on bipap Cognition:    Not following commands, Awake but not alert Cranial Nerves:    The pupils are equal, round, and reactive to light. Does not blink to threat on the left. Eyes deviated does not track or cross midline. Cannot assess face symmetry due to bipap. Appears hearing intact to voice. Cannot assess voice, palate, tongue.  Coordination:    Attempted, could not follow commands. No ataxia noted.  Motor Observation:    No asymmetry, no atrophy, and no involuntary movements noted. Tone:    LUE decreased tone     Strength:    Left hemiplegia. Moving right arm and right leg spontaneously, will withdraw LLE but not LUE   ASSESSMENT/PLAN Micheal Holt is a 78 y.o. male with history of chronic diastolic CHF, atrial fibrillation, prior TIA, OSA, HTN, DM, CKD and HLD presenting with a suspected fall, elevated BP and left sided weakness. He did not receive IV t-PA due to late presentation.  Possible Stroke:  MRI pending  Resultant  Left hemiplegia  CT head - Right parietal hypodensity may represent acute infarct.  MRI head - pending  MRA head - pending  CTA H&N - not performed  Carotid Doppler - pending  2D Echo - pending  Corona virus - negative  LDL - 93  HgbA1c  - 7.1  UDS - not performed  VTE prophylaxis - Lovenox  Diet - NPO  Eliquis and/or Xarelto prior to admission, now on aspirin 300 mg suppository daily  Patient counseled to be compliant with his antithrombotic medications  Ongoing aggressive stroke risk factor management  Therapy recommendations:  pending  Disposition:  Pending  Hypertension  Blood pressure somewhat high at times but within post stroke parameters . Permissive hypertension (OK if < 220/120) but gradually normalize in 5-7 days . Long-term BP goal normotensive  Hyperlipidemia  Lipid lowering medication PTA: none  LDL93, goal < 70  Current lipid lowering medication: Lipitor 40 mg daily  Continue statin at discharge  Diabetes  HgbA1c 7.1, goal < 7.0  Uncontrolled  Other Stroke Risk Factors  Advanced  age  Over weight, Body mass index is 28.94 kg/m., recommend weight loss, diet and exercise as appropriate   TIA hx  Family hx stroke (mother)  Obstructive sleep apnea  Atrial fibrillation  Non compliance with medications   Other Active Problems  Medical non compliance   Leukocytosis - 15.7->12.5 (afebrile)  Mild thrombocytopenia - 116  CKD - creatinine - 1.90->2.03  CHF hx  PLAN  Await MRI  Assess DOAC safety in non compliant pt   Hospital day # 1  Personally examined patient and images, and have participated in and made any corrections needed to history, physical, neuro exam,assessment and plan as stated above.  I have personally obtained the history, evaluated lab date, reviewed imaging studies and agree with radiology interpretations.    Sarina Ill, MD Stroke Neurology   A total of 25 minutes was spent for the care of this patient.  To contact Stroke Continuity provider, please refer to [email or web address] After hours, contact General Neurology

## 2018-12-31 NOTE — Progress Notes (Signed)
Family medicine interim progress note  Informed that patient with shortness of breath, tachypnea after returning from MRI. Given his imaging and initial exam felt to be fluid overloaded. Will give lasix 40mg  IV x1. Patient too agitated for MRI. Not informed until patient back in room. Can try again later in day when patient less agitated.  Guadalupe Dawn MD PGY-2 Family Medicine Resident

## 2019-01-01 ENCOUNTER — Inpatient Hospital Stay (HOSPITAL_COMMUNITY): Payer: Medicare Other

## 2019-01-01 DIAGNOSIS — R1314 Dysphagia, pharyngoesophageal phase: Secondary | ICD-10-CM | POA: Diagnosis present

## 2019-01-01 DIAGNOSIS — I639 Cerebral infarction, unspecified: Secondary | ICD-10-CM

## 2019-01-01 LAB — CBC WITH DIFFERENTIAL/PLATELET
Abs Immature Granulocytes: 0.04 10*3/uL (ref 0.00–0.07)
Basophils Absolute: 0.1 10*3/uL (ref 0.0–0.1)
Basophils Relative: 1 %
Eosinophils Absolute: 0.1 10*3/uL (ref 0.0–0.5)
Eosinophils Relative: 1 %
HCT: 46.5 % (ref 39.0–52.0)
Hemoglobin: 15.5 g/dL (ref 13.0–17.0)
Immature Granulocytes: 0 %
Lymphocytes Relative: 9 %
Lymphs Abs: 1 10*3/uL (ref 0.7–4.0)
MCH: 28.3 pg (ref 26.0–34.0)
MCHC: 33.3 g/dL (ref 30.0–36.0)
MCV: 85 fL (ref 80.0–100.0)
Monocytes Absolute: 0.9 10*3/uL (ref 0.1–1.0)
Monocytes Relative: 7 %
Neutro Abs: 9.8 10*3/uL — ABNORMAL HIGH (ref 1.7–7.7)
Neutrophils Relative %: 82 %
Platelets: 288 10*3/uL (ref 150–400)
RBC: 5.47 MIL/uL (ref 4.22–5.81)
RDW: 13.2 % (ref 11.5–15.5)
WBC: 11.9 10*3/uL — ABNORMAL HIGH (ref 4.0–10.5)
nRBC: 0 % (ref 0.0–0.2)

## 2019-01-01 LAB — BASIC METABOLIC PANEL
Anion gap: 12 (ref 5–15)
BUN: 23 mg/dL (ref 8–23)
CO2: 23 mmol/L (ref 22–32)
Calcium: 8.5 mg/dL — ABNORMAL LOW (ref 8.9–10.3)
Chloride: 108 mmol/L (ref 98–111)
Creatinine, Ser: 1.96 mg/dL — ABNORMAL HIGH (ref 0.61–1.24)
GFR calc Af Amer: 37 mL/min — ABNORMAL LOW (ref >60)
GFR calc non Af Amer: 32 mL/min — ABNORMAL LOW (ref >60)
Glucose, Bld: 134 mg/dL — ABNORMAL HIGH (ref 70–99)
Potassium: 3.2 mmol/L — ABNORMAL LOW (ref 3.5–5.1)
Sodium: 143 mmol/L (ref 135–145)

## 2019-01-01 LAB — GLUCOSE, CAPILLARY
Glucose-Capillary: 101 mg/dL — ABNORMAL HIGH (ref 70–99)
Glucose-Capillary: 108 mg/dL — ABNORMAL HIGH (ref 70–99)
Glucose-Capillary: 125 mg/dL — ABNORMAL HIGH (ref 70–99)
Glucose-Capillary: 126 mg/dL — ABNORMAL HIGH (ref 70–99)

## 2019-01-01 LAB — MRSA PCR SCREENING: MRSA by PCR: NEGATIVE

## 2019-01-01 LAB — APTT: aPTT: 49 seconds — ABNORMAL HIGH (ref 24–36)

## 2019-01-01 LAB — HEPARIN LEVEL (UNFRACTIONATED): Heparin Unfractionated: 0.14 IU/mL — ABNORMAL LOW (ref 0.30–0.70)

## 2019-01-01 MED ORDER — POTASSIUM CHLORIDE 10 MEQ/100ML IV SOLN
10.0000 meq | INTRAVENOUS | Status: AC
  Administered 2019-01-01 (×3): 10 meq via INTRAVENOUS
  Filled 2019-01-01 (×4): qty 100

## 2019-01-01 MED ORDER — NITROGLYCERIN 0.1 MG/HR TD PT24
0.1000 mg | MEDICATED_PATCH | Freq: Every day | TRANSDERMAL | Status: DC
  Administered 2019-01-01: 12:00:00 0.1 mg via TRANSDERMAL
  Filled 2019-01-01 (×2): qty 1

## 2019-01-01 MED ORDER — METOPROLOL TARTRATE 5 MG/5ML IV SOLN
15.0000 mg | Freq: Four times a day (QID) | INTRAVENOUS | Status: DC
  Administered 2019-01-01 – 2019-01-04 (×14): 15 mg via INTRAVENOUS
  Filled 2019-01-01 (×15): qty 15

## 2019-01-01 MED ORDER — HALOPERIDOL LACTATE 5 MG/ML IJ SOLN
1.0000 mg | Freq: Four times a day (QID) | INTRAMUSCULAR | Status: AC | PRN
  Administered 2019-01-02: 05:00:00 1 mg via INTRAVENOUS
  Filled 2019-01-01: qty 1

## 2019-01-01 MED ORDER — HALOPERIDOL 2 MG PO TABS
2.0000 mg | ORAL_TABLET | Freq: Once | ORAL | Status: DC | PRN
  Filled 2019-01-01: qty 1

## 2019-01-01 MED ORDER — HEPARIN (PORCINE) 25000 UT/250ML-% IV SOLN
1550.0000 [IU]/h | INTRAVENOUS | Status: DC
  Administered 2019-01-01: 18:00:00 1100 [IU]/h via INTRAVENOUS
  Administered 2019-01-03: 07:00:00 1600 [IU]/h via INTRAVENOUS
  Filled 2019-01-01 (×3): qty 250

## 2019-01-01 MED ORDER — CEFAZOLIN SODIUM-DEXTROSE 1-4 GM/50ML-% IV SOLN
1.0000 g | Freq: Three times a day (TID) | INTRAVENOUS | Status: DC
  Administered 2019-01-01 – 2019-01-04 (×10): 1 g via INTRAVENOUS
  Filled 2019-01-01 (×12): qty 50

## 2019-01-01 MED ORDER — POTASSIUM CHLORIDE 10 MEQ/100ML IV SOLN
10.0000 meq | Freq: Once | INTRAVENOUS | Status: AC
  Administered 2019-01-01: 14:00:00 10 meq via INTRAVENOUS
  Filled 2019-01-01: qty 100

## 2019-01-01 MED ORDER — FUROSEMIDE 10 MG/ML IJ SOLN
40.0000 mg | Freq: Two times a day (BID) | INTRAMUSCULAR | Status: DC
  Administered 2019-01-01 – 2019-01-02 (×3): 40 mg via INTRAVENOUS
  Filled 2019-01-01 (×3): qty 4

## 2019-01-01 MED ORDER — ENOXAPARIN SODIUM 100 MG/ML ~~LOC~~ SOLN
90.0000 mg | Freq: Two times a day (BID) | SUBCUTANEOUS | Status: DC
  Filled 2019-01-01 (×2): qty 0.9

## 2019-01-01 MED ORDER — RISPERIDONE MICROSPHERES ER 25 MG IM SRER: 25.0000 mg | Freq: Once | INTRAMUSCULAR | Status: DC

## 2019-01-01 MED ORDER — HALOPERIDOL LACTATE 5 MG/ML IJ SOLN: 4.0000 mg | Freq: Once | INTRAMUSCULAR | Status: DC | PRN

## 2019-01-01 NOTE — Progress Notes (Signed)
EEG complete - results pending 

## 2019-01-01 NOTE — Progress Notes (Addendum)
Pharmacy Antibiotic Note  Micheal Holt is a 78 y.o. male admitted on 12/30/2018 with R MCA ischemic infarct, Also found to have UTI.  Pharmacy has been consulted for cefazolin dosing.  Urine culture resulted with 100K E. Coli. Susceptibilities to follow.  Plan: Start cefazolin 1g q8h Will continue to follow urine culture/sensitivities and renal function   Height: 5\' 10"  (177.8 cm) Weight: 201 lb 11.5 oz (91.5 kg) IBW/kg (Calculated) : 73  Temp (24hrs), Avg:98.3 F (36.8 C), Min:97.9 F (36.6 C), Max:98.6 F (37 C)  Recent Labs  Lab 12/30/18 2003 12/30/18 2051 12/30/18 2218 12/31/18 0422 01/01/19 0609  WBC 15.7*  --   --  12.5* 11.9*  CREATININE 1.90*  --   --  2.03* 1.96*  LATICACIDVEN  --  2.8* 2.5* 3.1*  --     Estimated Creatinine Clearance: 35.3 mL/min (A) (by C-G formula based on SCr of 1.96 mg/dL (H)).    No Known Allergies  Antimicrobials this admission: Cefazolin 4/27 >>  Microbiology results: 4/25 BCx: NGTD 4/25 UCx: >100K E coli 4/27 MRSA PCR: neg COVID negative  Thank you for allowing pharmacy to be a part of this patient's care.  Janae Bridgeman, PharmD PGY1 Pharmacy Resident Phone: [phone] 01/01/2019 11:43 AM   _____________   ADDENDUM:  Pharmacy also consulted for therapeutic dose lovenox for afib. CrCl ~73mL/min.   Received enoxaparin for DVT prophylaxis at 0800 today. Will start therapeutic anticoagulation 12 hours later at 2000.  Plan: Start enoxaparin 90mg  q12h (1mg /kg) Monitor renal function and CBC F/u ability for long term oral anticoagulation  Janae Bridgeman, PharmD PGY1 Pharmacy Resident Phone: [phone] 01/01/2019 11:54 AM

## 2019-01-01 NOTE — Procedures (Signed)
  Micheal A. Merlene Laughter, MD     [email or web address]           HISTORY: This is 78 year old man who presents with acute left hemiparesis and dysarthria/dysphagia.  The studies been done to evaluate for post seizure paralysis.  MEDICATIONS:  Current Facility-Administered Medications:  .  acetaminophen (TYLENOL) tablet 650 mg, 650 mg, Oral, Q4H PRN **OR** acetaminophen (TYLENOL) solution 650 mg, 650 mg, Per Tube, Q4H PRN **OR** acetaminophen (TYLENOL) suppository 650 mg, 650 mg, Rectal, Q4H PRN, Guadalupe Dawn, MD .  albuterol (VENTOLIN HFA) 108 (90 Base) MCG/ACT inhaler 1-2 puff, 1-2 puff, Inhalation, Q6H PRN, Guadalupe Dawn, MD .  aspirin suppository 300 mg, 300 mg, Rectal, Daily, 300 mg at 01/01/19 1100 **OR** aspirin tablet 325 mg, 325 mg, Oral, Daily, Guadalupe Dawn, MD .  atorvastatin (LIPITOR) tablet 40 mg, 40 mg, Oral, q1800, Anderson, Chelsey L, DO, Stopped at 12/31/18 1652 .  ceFAZolin (ANCEF) IVPB 1 g/50 mL premix, 1 g, Intravenous, Q8H, McDiarmid, Blane Ohara, MD, Stopped at 01/01/19 1344 .  furosemide (LASIX) injection 40 mg, 40 mg, Intravenous, BID, Beard, Samantha N, DO, 40 mg at 01/01/19 1713 .  haloperidol lactate (HALDOL) injection 1 mg, 1 mg, Intravenous, Q6H PRN, Beard, Samantha N, DO .  insulin aspart (novoLOG) injection 0-9 Units, 0-9 Units, Subcutaneous, TID WC, Guadalupe Dawn, MD, 1 Units at 01/01/19 1356 .  metoprolol tartrate (LOPRESSOR) injection 15 mg, 15 mg, Intravenous, Q6H, Wilber Oliphant, MD, 15 mg at 01/01/19 1713 .  nitroGLYCERIN (NITRODUR - Dosed in mg/24 hr) patch 0.1 mg, 0.1 mg, Transdermal, Daily, McDiarmid, Blane Ohara, MD, 0.1 mg at 01/01/19 1154     ANALYSIS: A 16 channel recording using standard 10 20 measurements is conducted for 24 minutes.  The background activity gets as high as 8 Hz.  There is beta activity observed in the frontal areas.  Awake and drowsy activities are observed.  Photic stimulation and hyperventilation are not  conducted.  There is few episodes of 3 to 4 Hz focal slowing at C4 P4.  There is also some rhythmic generalized delta slowing with frontocentral maximum activity.  No epileptiform activities are observed.   IMPRESSION: 1.  Focal central parietal slowing on the right side which can be seen from focal cerebral disturbance and/or epileptic focus. 2.  Infrequent generalized rhythmic delta activity with frontocentral maximum typically seen in toxic metabolic processes.      Jaquari Reckner A. Merlene Holt, M.D.  Diplomate, Tax adviser of Psychiatry and Neurology ( Neurology).

## 2019-01-01 NOTE — Progress Notes (Signed)
RT brought to bedside for gurgling noise with breathing.  Stated they could perform nasotracheal suction.  Evaluated patient at bedside.  He is gurgling with breathing, however he is protecting his airway with oxygen saturations remaining around 98% on 2L, and no increased work of breathing.  Discussed option of NTS with RT, Amy.  Feel that NTS would temporarily get rid of the gurgling noise, however would not make much difference clinically and would likely have recurrent gurgling as he is not able to cough his secretions as normal.  Additionally, patient would not likely tolerate this procedure well.  Will hold off on nasotracheal suction at this time.  May reevaluate if needed.  Patriciaann Clan, DO
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
## 2019-01-01 NOTE — Progress Notes (Signed)
STROKE TEAM PROGRESS NOTE   SUBJECTIVE (INTERVAL HISTORY) Patient is lying in bed.  He is obtunded.  He can be aroused.  His speech is difficult to understand.  He has left hemiplegia.  He has trouble protecting his airway.  He has seen speech pathology and has been advised n.p.o. status.   OBJECTIVE Vitals:   01/01/19 0418 01/01/19 0808 01/01/19 0818 01/01/19 1218  BP: (!) 204/136  (!) 203/144 (!) 196/116  Pulse: 98 89 76 92  Resp: 19 20 (!) 25 (!) 26  Temp: 98.2 F (36.8 C)  98.6 F (37 C) 98.3 F (36.8 C)  TempSrc: Oral  Oral Oral  SpO2: 98% 97% 99% 98%  Weight:      Height:        CBC:  Recent Labs  Lab 12/31/18 0422 01/01/19 0609  WBC 12.5* 11.9*  NEUTROABS  --  9.8*  HGB 15.2 15.5  HCT 46.6 46.5  MCV 86.8 85.0  PLT 116* 425    Basic Metabolic Panel:  Recent Labs  Lab 12/31/18 0422 01/01/19 0609  NA 140 143  K 4.3 3.2*  CL 107 108  CO2 16* 23  GLUCOSE 244* 134*  BUN 21 23  CREATININE 2.03* 1.96*  CALCIUM 8.8* 8.5*    Lipid Panel:     Component Value Date/Time   CHOL 152 12/31/2018 0422   CHOL 176 09/11/2018 1452   TRIG 55 12/31/2018 0422   HDL 48 12/31/2018 0422   HDL 38 (L) 09/11/2018 1452   CHOLHDL 3.2 12/31/2018 0422   VLDL 11 12/31/2018 0422   LDLCALC 93 12/31/2018 0422   LDLCALC 94 09/11/2018 1452   HgbA1c:  Lab Results  Component Value Date   HGBA1C 7.1 (H) 12/31/2018   Urine Drug Screen:     Component Value Date/Time   LABOPIA NONE DETECTED 03/16/2016 1650   COCAINSCRNUR NONE DETECTED 03/16/2016 1650   LABBENZ NONE DETECTED 03/16/2016 1650   AMPHETMU NONE DETECTED 03/16/2016 1650   THCU NONE DETECTED 03/16/2016 1650   LABBARB NONE DETECTED 03/16/2016 1650    Alcohol Level     Component Value Date/Time   ETH <10 07/05/2017 1149    IMAGING Ct Head Wo Contrast Ct Cervical Spine Wo Contrast Ct Maxillofacial Wo Contrast 12/30/2018 1. Right parietal hypodensity may represent acute infarct. MRI would be necessary to confirm.   2. Atrophy and ventricular enlargement is stable. Chronic ischemic changes are present bilaterally. No intracranial hemorrhage  3. Negative for facial fracture  4. Negative for cervical spine fracture.   MRI / MRA Brain  pending    Dg Chest [facility] 12/30/2018 Interstitial and alveolar airspace opacities concerning for edema/CHF.   Transthoracic Echocardiogram  Right Carotid: There is no evidence of stenosis in the right ICA. Left Carotid: There is no evidence of stenosis in the left ICA. Vertebrals:  Bilateral vertebral arteries demonstrate antegrade flow. Subclavians: Normal flow hemodynamics were seen in bilateral subclavian arteries.  Bilateral Carotid Dopplers  12/31/2018  1. The left ventricle has severely reduced systolic function, with an ejection fraction of 25-30%. The cavity size was normal. Left ventricular diastolic Doppler parameters are consistent with indeterminate diastolic dysfunction. Left ventricular  diffuse hypokinesis.  2. Left atrial size was moderately dilated.  3. Aortic valve regurgitation is trivial by color flow Doppler.  4. Mitral valve regurgitation is mild to moderate by color flow Doppler.  5. No intracardiac thrombi or masses were visualized. Definity was used for LV endocardial border enhancement, no evidence  of apical or mural thrombus noted.  EKG - atrial fibrillation - ventricular response 112 BPM (See cardiology reading for complete details)   PHYSICAL EXAM Elderly Caucasian male in mild respiratory distress. . Afebrile. Head is nontraumatic. Neck is supple without bruit.    Cardiac exam no murmur or gallop. Lungs are clear to auscultation. Distal pulses are well felt. Blood pressure (!) 196/116, pulse 92, temperature 98.3 F (36.8 C), temperature source Oral, resp. rate (!) 26, height 5\' 10"  (1.778 m), weight 91.5 kg, SpO2 98 %. Gen: NAD, [place]/AT, on bipap                 Neuro: Speech:    Severe dysarthria can be understood with  difficulty.  Nonfluent speech. Cognition: following simple commands only.drowsy but can be aroused  cranial Nerves:    The pupils are equal, round, and reactive to light. Does not blink to threat on the left. Eyes deviated does not track or cross midline. Cannot assess face symmetry due to bipap. Appears hearing intact to voice. Cannot assess voice, palate, tongue. Coordination:    Attempted, could not follow commands. No ataxia noted. Motor Observation:    No asymmetry, no atrophy, and no involuntary movements noted. Tone:    LUE decreased tone  Strength:    Left hemiplegia. Moving right arm and right leg spontaneously, will withdraw LLE but not LUE   ASSESSMENT/PLAN [name] is a 78 y.o. male with history of chronic diastolic CHF, atrial fibrillation, prior TIA, OSA, HTN, DM, CKD and HLD presenting with a suspected fall (found down prone at home), elevated BP and left sided weakness. He did not receive IV t-PA due to late presentation.  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  MRI head - pending  MRA head - pending  Carotid Doppler - B ICA 1-39% stenosis, VAs antegrade   2D Echo - EF 25-30%. LA dilated. No source of embolus   Corona virus - negative  EEG pending   LDL - 93  HgbA1c - 7.1  UDS - not performed  VTE prophylaxis - Lovenox 90 bid  Eliquis and/or Xarelto prior to admission, now on aspirin 300 mg suppository daily and full dose Lovenox 90 bid.   Therapy recommendations:  SNF  Disposition:  Pending  Atrial Fibrillation  Home anticoagulation:  Eliquis/Xarelto   Now on full dose lovenox 90 bid  Given unknown stroke with MRI pending, recommend stopping full dose lovenox given bleeding risk. Discussed w/ Dr. Leonie Man who will d/c   Hypertension  Blood pressure elevated but within post stroke parameters  Concern on full dose lovenox and  bleeding risk. Discussed w/ Dr. Leonie Man who will d/c . Permissive hypertension (OK if < 220/120) but gradually normalize in 5-7 days . Long-term BP goal normotensive  Hyperlipidemia  Lipid lowering medication PTA: none  LDL93, goal < 70  Current lipid lowering medication: Lipitor 40 mg daily (on hold as NPO)  Continue statin at discharge  Diabetes  HgbA1c 7.1, goal < 7.0  Uncontrolled  Dysphagia  Failed swallow  Other Stroke Risk Factors  Advanced age  Over weight, Body mass index is 28.94 kg/m., recommend weight loss, diet and exercise as appropriate   TIA hx 2004  Family hx stroke (mother)  Obstructive sleep apnea, noncompliant w/ CPAP  Acute on chronic CHF  Other Active Problems  Medical non compliance  UTI. Leukocytosis - 15.7->12.5 (afebrile)  Mild thrombocytopenia - 116  CKD stage III  Lactic acidosis  BPH  Hospital day # 2 I have personally obtained history,examined this patient, reviewed notes, independently viewed imaging studies, participated in medical decision making and plan of care.ROS completed by me personally and pertinent positives fully documented  I have made any additions or clarifications directly to the above note.   He presented with right MCA infarct secondary to atrial fibrillation and not being on anticoagulation.  Patient has significant dysarthria dysphasia and left hemiplegia.  Recommend discontinue full dose Lovenox as it has been shown to be harmful in the setting of acute stroke due to increased bleed risk.  May use IV heparin stroke protocol instead till patient is able to swallow or has a feeding tube.  Prognosis is guarded.  Recommend discuss goals of care with family.  Check MRI scan and may need Haldol for sedation for the study.  Discussed with Dr. Darrelyn Hillock, MD.  Greater than 50% time during this 35-minute visit was spent on counseling and coordination of care about his embolic stroke and discussion about evaluation  and treatment plan Antony Contras, MD Medical Director [place] Pager: [phone] 01/01/2019 4:36 PM   To contact Stroke Continuity provider, please refer to [email or web address] After hours, contact General Neurology

## 2019-01-01 NOTE — Progress Notes (Signed)
Attempted to place NIV on patient at this time, patient became agitated from his resting state. Took BIPAP off patient calmed down, no distress noted, RCP will continue to follow.

## 2019-01-01 NOTE — Progress Notes (Signed)
Spoke with the neurology team who is recommending starting NG tube nutrition as patient has been NPO for 2 days and failed swallow study.  Touched base with his healthcare POA, daughter Tomi Bamberger, about starting an NG tube.  She is hesitant at this time as he is previously stated he would not want any feeding tubes long-term.  Discussed with her that it is challenging to predict his possible recovery, however there is hopes that he will gain some speech and swallowing function.  Discussed the strong likelihood he would likely not return back to his baseline and would need facility care long-term moving forward.  She would like to wait to place a NG tube until his MRI brain is back and talking with her brother more on the subject.  She is hopeful to speak with his neurologist after the MRI, let her know I would be happy to reach out to the team tomorrow to see if they can give her an update from a neurological standpoint.   We will hold off on NG tube feeds at this time.  Will discuss further tomorrow, 4/28.  Patriciaann Clan, DO

## 2019-01-01 NOTE — Progress Notes (Signed)
Family Medicine Teaching Service Daily Progress Note Intern Pager: [phone]  Patient name: Micheal Holt Medical record number: [id number] Date of birth: 09/15/1940 Age: 78 y.o. Gender: male  Primary Care Provider: Leonard Downing, MD Consultants: Neuro Code Status: DNR/DNI  Pt Overview and Major Events to Date:  4/26 admitted   Assessment and Plan: Micheal Grattan Staffordis a 78 y.o.malepresenting with weakness, fall and new CVA. PMH is significant forHFrEF,HTN,T2DM,OSA (noncompliant with CPAP), HLD, CKD III, and prior TIA  CVA,right MCA ischemic infarct: Subacute/Acute, stable. Continued decreased muscle strength on the left, left facial droop with speech difficulty.  Echo EF 25-30%, no intracardiac thrombi or masses identified.  In the setting of chronic A. fib with anticoagulation, HFrEF, HTN, DM, and previous TIA. - Neurology following, appreciate further recommendations - Aspirin 300 mg suppository -Obtain MRI/MRA brain-challenged yesterday by his SOB as below-will attempt to get today w/ 1 mg haldol 1 hour prior to  -S/p carotid Dopplers -PT/OT/SLP evaluation -CM, vitals per routine -Start atorvastatin when can p.o.  Acute on chronic HFrEF:Improving.  Started back on BiPAP early this morning due to reported desats. However now back on [place]. Concern for SOB may be complicated by what appears to be chey[place] stokes breathing in setting of stroke. -2L since admit. EF 25-30% on Echo.  - Lasix 40 mg IV twice daily - Strict I and O's, daily weights - Oxygen therapy as needed -Attempt to avoid BiPAP   UTI: Acute.  Endorses dysuria with recent urinary incontinence on admit with > 100,000 colonies of gram-negative rods.  -Start Ancef IV (4/27-)   Arial fibrillationwithout JYN:WGNFAOZ, stable.  HR 90-100.  Mali vasc 7.  Has been taking both Eliquis and Xarelto instead of discontinuing Xarelto as instructed, and metoprolol for rate control. - Start Lovenox full dose per  pharmacy recommendations while deciding on appropriate long-term anticoagulation plan (likely to do Eliquis given concern for appropriate fatty meal with Xarelto in the future)  - Cont IV Lopressor 15 mg every 6  Hypertension: Chronic, poorly controlled.   SBP ranging 170-200s overnight.  Only taking metoprolol, previous lisinopril D/C in January for noncompliance. - Neurology recommended against permissive hypertension at this time given CHF -Cont IV lopressor as above  - Likely restart lisinopril low dose when po   Lactic Acidosis: Likely related to volume status with CHF. No further monitoring.  - Treat CHF as above  CKD, stageIII:Chronic, stable.  Creatinine 1.96, baseline around 1.9.  - Monitor I and O's - Monitor BMP - Avoid nephrotoxic medications as possible  T2DM:Chronic, stable.   CBG 100-200 this a.m.  A1c 7.1.  Takes glipizide 5 mg, Jardiance 10 mg, and Ozempic 1 mg weekly.  - SSI - CBGs with meals and at bedtime - Consider DC of glipizide at discharge  H/o TIA (2004)  Stenosis of posterior cerebral artery: Chronic, stable. Previously on ASA and Clopidogrel, stopped when patient started Eliquis.  - Start atorvastatin when po   OSA: Chronic, stable.   Noncompliant with CPAP.  -CPAP nightly,patient with negative COVID-19 rapid testing in ED  HYQ:MVHQION, stable. Lipid panel WNL.  Patient is currently not on statin secondary to noncompliance. - Start atorvastatin when po   BPH: Chronic, stable. Patient on flomax at home daily.  - Continue Flomax  FEN/GI:NPO until passes swallow eval Prophylaxis:Lovenox  Disposition:Continue work-up  Subjective:  Reportedly put back on BiPAP last night, early this morning due to worsening "numbers" while on CPAP.  Appears agitated with BiPAP mask, putting his  lower lip outside of the mask.   Objective: Temp:  [97.5 F (36.4 C)-98.4 F (36.9 C)] 98.2 F (36.8 C) (04/27 0418) Pulse Rate:  [91-140] 98  (04/27 0418) Resp:  [17-36] 19 (04/27 0418) BP: (166-204)/(103-136) 204/136 (04/27 0418) SpO2:  [95 %-99 %] 98 % (04/27 0418) FiO2 (%):  [30 %] 30 % (04/27 0349) Physical Exam: General: Older gentleman, intermittently alert, in mild distress with BiPAP on, appears tired  HEENT: NCAT, MM dry Cardiac: Irregular rate and rhythm, no murmurs, gallops or rubs noted Lungs: Difficult to auscultate 2/2 BiPAP however no overt wheezing or crackles.  Mild increased work of breathing.  Abdomen: soft, non-distended, normoactive BS Msk: Moves all extremities spontaneously  Neuro: Awake, intermittently alert.  Able to follow most commands by performing, only nodding head for yes or no, not speaking.  Preserved muscle strength in right upper and lower extremity.  Able to move his LLE spontaneously.  Sensation intact to distal extremities. Ext: Warm, dry, 2+ distal pulses, no edema    Laboratory: Recent Labs  Lab 12/30/18 2003 12/31/18 0422  WBC 15.7* 12.5*  HGB 16.3 15.2  HCT 49.6 46.6  PLT 305 116*   Recent Labs  Lab 12/30/18 2003 12/31/18 0422  NA 138 140  K 3.8 4.3  CL 105 107  CO2 17* 16*  BUN 19 21  CREATININE 1.90* 2.03*  CALCIUM 8.9 8.8*  PROT 6.4*  --   BILITOT 1.4*  --   ALKPHOS 89  --   ALT 12  --   AST 21  --   GLUCOSE 242* 244*    Imaging/Diagnostic Tests: No results found.  Patriciaann Clan, DO 01/01/2019, 7:19 AM PGY-1, [employer] Intern pager: [phone], text pages welcome

## 2019-01-01 NOTE — Progress Notes (Signed)
ANTICOAGULATION CONSULT NOTE - Follow-up Consult  Pharmacy Consult for heparin Indication: stroke  No Known Allergies  Patient Measurements: Height: 5\' 10"  (177.8 cm) Weight: 201 lb 11.5 oz (91.5 kg) IBW/kg (Calculated) : 73 Heparin Dosing Weight: 92.3 kg  Vital Signs: Temp: 98.8 F (37.1 C) (04/27 2338) Temp Source: Oral (04/27 2338) BP: 194/123 (04/27 2338) Pulse Rate: 95 (04/27 2338)  Labs: Recent Labs    12/30/18 2003 12/31/18 0422 01/01/19 0609 01/01/19 2311  HGB 16.3 15.2 15.5  --   HCT 49.6 46.6 46.5  --   PLT 305 116* 288  --   APTT 28  --   --  49*  LABPROT 14.7  --   --   --   INR 1.2  --   --   --   HEPARINUNFRC  --   --   --  0.14*  CREATININE 1.90* 2.03* 1.96*  --   CKTOTAL 393  --   --   --     Estimated Creatinine Clearance: 35.3 mL/min (A) (by C-G formula based on SCr of 1.96 mg/dL (H)).   Assessment: 78 yo male presents with new CVA. PMH includes HFrEF, HTN, T2DM, OSA (noncompliant with CPAP), HLD,  CKD III, and prior TIA. Patient on apixaban PTA with unknown date of last dose. Pharmacy consulted to start heparin gtt for CVA. H/H 15.5/46.5. Plts 288. No s/sx of bleeding documented.   Heparin level 0.14 (subtherapeutic) and PTT 49 sec (also subtherapeutic) on gtt at 1100 units/hr. No issues with line or bleeding reported per RN.  Goal of Therapy:  Heparin goal 0.3-0.5 units/ml Monitor platelets by anticoagulation protocol: Yes   Plan:  Increase heparin drip to 1300 units/hr Will f/u 8hr heparin level and PTT (just to make sure correlating) Monitor daily heparin levels, CBCs, and s/sx of bleeding  F/u when to transition to oral anticoagulation   Sherlon Handing, PharmD, BCPS Clinical pharmacist  **Pharmacist phone directory can now be found on [email or web address] ([email or web address]).  Listed under [place]. 01/01/2019   11:39 PM

## 2019-01-01 NOTE — Evaluation (Signed)
Physical Therapy Evaluation Patient Details Name: Micheal Holt MRN: [id number] DOB: 01-Nov-1940 Today's Date: 01/01/2019   History of Present Illness  Micheal Holt is a 78 y.o. male presenting with weakness, fall and new CVA. PMH is significant for HFrEF, HTN, T2DM, OSA (noncompliant with CPAP), HLD,  CKD III, and prior TIA. CT: Right parietal hypodensity may represent acute infarct; MRI pending  Clinical Impression  Pt admitted with above diagnosis. Pt currently with functional limitations due to the deficits listed below (see PT Problem List). At the time of PT eval pt's BP was 196/116 at rest in supine, and assessment was limited to bed level. Pt following some commands however kept eyes closed for most of session. Anticipate pt will need SNF level rehab at d/c, however will continue to assess progress and update d/c disposition as appropriate. Acutely, pt will benefit from skilled PT to increase their independence and safety with mobility to allow discharge to the venue listed below.       Follow Up Recommendations SNF;Supervision/Assistance - 24 hour    Equipment Recommendations  Other (comment)(TBD by next venue of care)    Recommendations for Other Services       Precautions / Restrictions Precautions Precautions: Fall Restrictions Weight Bearing Restrictions: No      Mobility  Bed Mobility Overal bed mobility: Needs Assistance Bed Mobility: Rolling Rolling: Total assist         General bed mobility comments: total A +2 (pt 0%) to roll to right ; total A +2 (pt 5%) to roll left (bent up RLE on command). Pt attempted to push with RLE when we were scooting up towards [place].   Transfers                 General transfer comment: Did not attempt EOB or OOB today due to increased BP in supine (196/116)  Ambulation/Gait                Stairs            Wheelchair Mobility    Modified Rankin (Stroke Patients Only) Modified Rankin (Stroke Patients  Only) Pre-Morbid Rankin Score: No symptoms Modified Rankin: Severe disability     Balance       Sitting balance - Comments: Not able to be tested this session                                     Pertinent Vitals/Pain Pain Assessment: Faces Faces Pain Scale: Hurts a little bit Pain Location: points to his upper chest area Pain Descriptors / Indicators: (unable) Pain Intervention(s): Monitored during session;Repositioned    Home Living Family/patient expects to be discharged to:: Private residence                 Additional Comments: Per chart pt from home, but unsure of PLOF and if has assist or lives alone    Prior Function                 Hand Dominance        Extremity/Trunk Assessment   Upper Extremity Assessment Upper Extremity Assessment: Defer to OT evaluation LUE Deficits / Details: Flaccid    Lower Extremity Assessment Lower Extremity Assessment: LLE deficits/detail LLE Deficits / Details: Bilateral weakness, however pt able to move the RLE to command more than on the LLE. Noted some possible extensor tone on the LLE when repositioning  it.        Communication      Cognition Arousal/Alertness: Lethargic(eyes closed most of session) Behavior During Therapy: Flat affect Overall Cognitive Status: No family/caregiver present to determine baseline cognitive functioning                                 General Comments: Followed one step commands with RUE and RLE, when asked to move left side he would move right side. Able to tell [place] his name and birthdate with increased time, not oriented to place or situation      General Comments      Exercises     Assessment/Plan    PT Assessment Patient needs continued PT services  PT Problem List Decreased strength;Decreased range of motion;Decreased activity tolerance;Decreased balance;Decreased mobility;Decreased knowledge of use of DME;Decreased cognition;Decreased  safety awareness;Decreased coordination;Decreased knowledge of precautions       PT Treatment Interventions DME instruction;Gait training;Stair training;Functional mobility training;Therapeutic activities;Therapeutic exercise;Neuromuscular re-education;Patient/family education;Cognitive remediation    PT Goals (Current goals can be found in the Care Plan section)  Acute Rehab PT Goals Patient Stated Goal: wanted water PT Goal Formulation: With patient Time For Goal Achievement: 01/15/19 Potential to Achieve Goals: Good    Frequency Min 4X/week   Barriers to discharge        Co-evaluation PT/OT/SLP Co-Evaluation/Treatment: Yes Reason for Co-Treatment: Necessary to address cognition/behavior during functional activity;For patient/therapist safety;To address functional/ADL transfers PT goals addressed during session: Mobility/safety with mobility;Balance;Proper use of DME         AM-PAC PT "6 Clicks" Mobility  Outcome Measure Help needed turning from your back to your side while in a flat bed without using bedrails?: Total Help needed moving from lying on your back to sitting on the side of a flat bed without using bedrails?: Total Help needed moving to and from a bed to a chair (including a wheelchair)?: Total Help needed standing up from a chair using your arms (e.g., wheelchair or bedside chair)?: Total Help needed to walk in hospital room?: Total Help needed climbing 3-5 steps with a railing? : Total 6 Click Score: 6    End of Session Equipment Utilized During Treatment: Oxygen Activity Tolerance: Patient limited by lethargy Patient left: in bed;with call bell/phone within reach Nurse Communication: Mobility status PT Visit Diagnosis: Other symptoms and signs involving the nervous system (R29.898);Hemiplegia and hemiparesis Hemiplegia - Right/Left: Left Hemiplegia - dominant/non-dominant: Non-dominant Hemiplegia - caused by: Cerebral infarction    Time: 1210-1240 PT  Time Calculation (min) (ACUTE ONLY): 30 min   Charges:   PT Evaluation $PT Eval Moderate Complexity: 1 Mod          [name], PT, DPT Acute Rehabilitation Services Pager: [phone] Office: [phone]   [name] 01/01/2019, 3:02 PM

## 2019-01-01 NOTE — Evaluation (Signed)
Occupational Therapy Evaluation Patient Details Name: Micheal Holt MRN: [id number] DOB: 1941-03-31 Today's Date: 01/01/2019    History of Present Illness Micheal Holt is a 78 y.o. male presenting with weakness, fall and new CVA. PMH is significant for HFrEF, HTN, T2DM, OSA (noncompliant with CPAP), HLD,  CKD III, and prior TIA. CT: Right parietal hypodensity may represent acute infarct; MRI pending   Clinical Impression   This 78 yo male admitted with above presents to acute OT with increased BP, left inattention, right head turn, decreased bed mobility, and flaccid RUE all affecting his safety and independence with basic ADLs. He will benefit from acute OT with follow up OT at SNF to work on increasing independence with precursors to basic ADLs and ADLs.    Follow Up Recommendations  SNF;Supervision/Assistance - 24 hour    Equipment Recommendations  Other (comment)(TBD next venue)       Precautions / Restrictions Precautions Precautions: Fall Restrictions Weight Bearing Restrictions: No      Mobility Bed Mobility Overal bed mobility: Needs Assistance Bed Mobility: Rolling Rolling: Total assist         General bed mobility comments: total A +2 (pt 0%) to roll to right ; total A +2 (pt 5%) to roll left (bent up RLE on command  Transfers                 General transfer comment: No to EOB or OOB today due to increased BP in supine (196/116)        ADL either performed or assessed with clinical judgement   ADL Overall ADL's : Needs assistance/impaired Eating/Feeding: NPO   Grooming: Wash/dry face;Bed level;Supervision/safety;Set up Grooming Details (indicate cue type and reason): Cues for thoroughness Upper Body Bathing: Maximal assistance;Bed level   Lower Body Bathing: Total assistance;Bed level   Upper Body Dressing : Total assistance;Bed level   Lower Body Dressing: Total assistance;Bed level                       Vision Baseline  Vision/History: (tendency to keep eyes closed 95% of session)              Pertinent Vitals/Pain Pain Assessment: Faces Faces Pain Scale: Hurts a little bit Pain Location: points to his upper chest area Pain Descriptors / Indicators: (unable) Pain Intervention(s): Monitored during session;Repositioned        Extremity/Trunk Assessment Upper Extremity Assessment Upper Extremity Assessment: LUE deficits/detail LUE Deficits / Details: Flaccid           Communication  expressive difficulties   Cognition Arousal/Alertness: Lethargic(eyes closed most of session) Behavior During Therapy: Flat affect Overall Cognitive Status: No family/caregiver present to determine baseline cognitive functioning                                 General Comments: Followed one step commands with RUE and RLE, when asked to move left side he would move right side. Able to tell [place] his name and birthdate with increased time, not oriented to place or situation              Home Living Family/patient expects to be discharged to:: Private residence                                 Additional Comments: Per chart pt from home, but  unsure of PLOF and if has A or lives A               OT Problem List: Decreased strength;Decreased range of motion;Decreased activity tolerance;Impaired balance (sitting and/or standing);Impaired vision/perception;Decreased coordination;Decreased cognition;Decreased safety awareness;Impaired tone;Impaired UE functional use;Pain      OT Treatment/Interventions: Self-care/ADL training;Balance training;Therapeutic exercise;Neuromuscular education;Therapeutic activities;Patient/family education;Visual/perceptual remediation/compensation;Cognitive remediation/compensation    OT Goals(Current goals can be found in the care plan section) Acute Rehab OT Goals Patient Stated Goal: wanted water OT Goal Formulation: With patient Time For Goal  Achievement: 01/15/19 Potential to Achieve Goals: Fair  OT Frequency: Min 2X/week(could benefit from more)   Barriers to D/C: Decreased caregiver support             AM-PAC OT "6 Clicks" Daily Activity     Outcome Measure Help from another person eating meals?: Total Help from another person taking care of personal grooming?: A Lot Help from another person toileting, which includes using toliet, bedpan, or urinal?: Total Help from another person bathing (including washing, rinsing, drying)?: A Lot Help from another person to put on and taking off regular upper body clothing?: Total Help from another person to put on and taking off regular lower body clothing?: Total 6 Click Score: 8   End of Session Nurse Communication: (breathing pattern and did not attempt to get pt up due to high BP)  Activity Tolerance: Patient limited by lethargy;Other (comment)(limited by increased BP) Patient left: in bed;with call bell/phone within reach;with bed alarm set  OT Visit Diagnosis: Other abnormalities of gait and mobility (R26.89);Muscle weakness (generalized) (M62.81);Hemiplegia and hemiparesis;Other symptoms and signs involving cognitive function;Cognitive communication deficit (R41.841);Pain Hemiplegia - Right/Left: Left Hemiplegia - dominant/non-dominant: (unknown) Hemiplegia - caused by: Cerebral infarction                Time: 1209-1240 OT Time Calculation (min): 31 min Charges:  OT General Charges $OT Visit: 1 Visit OT Evaluation $OT Eval Moderate Complexity: 1 Mod  ,Micheal Holt, OTR/L Acute [facility] [phone] Office [phone]     Micheal Holt 01/01/2019, 1:17 PM

## 2019-01-01 NOTE — Progress Notes (Signed)
Carotid duplex completed. Preliminary result in Chart review CV Proc. [facility], [place] 01/01/2019, 1:55 pm

## 2019-01-01 NOTE — Progress Notes (Signed)
RN spoke with MD concerning pt's breathing and the need for a possible suctioning; MD wanted RT to evaluate pt as well. RT called and notified; RT at bedside. Delia Heady RN

## 2019-01-01 NOTE — Progress Notes (Signed)
ANTICOAGULATION CONSULT NOTE - Initial Consult  Pharmacy Consult for heparin Indication: stroke  No Known Allergies  Patient Measurements: Height: 5\' 10"  (177.8 cm) Weight: 201 lb 11.5 oz (91.5 kg) IBW/kg (Calculated) : 73 Heparin Dosing Weight: 92.3 kg  Vital Signs: Temp: 98.9 F (37.2 C) (04/27 1500) Temp Source: Axillary (04/27 1500) BP: 176/134 (04/27 1500) Pulse Rate: 73 (04/27 1500)  Labs: Recent Labs    12/30/18 2003 12/31/18 0422 01/01/19 0609  HGB 16.3 15.2 15.5  HCT 49.6 46.6 46.5  PLT 305 116* 288  APTT 28  --   --   LABPROT 14.7  --   --   INR 1.2  --   --   CREATININE 1.90* 2.03* 1.96*  CKTOTAL 393  --   --     Estimated Creatinine Clearance: 35.3 mL/min (A) (by C-G formula based on SCr of 1.96 mg/dL (H)).   Medical History: Past Medical History:  Diagnosis Date  . Atrial fibrillation ([place])   . Cerebrovascular disease    by MRA in Feb 2004 with moderate posterior cerebral artery stenosis  . Chest pain    most likely Gastroesophageal reflux  . Chronic diastolic CHF (congestive heart failure), NYHA class 1 ([place])    currently euvolemic  . Chronic kidney disease (CKD), stage II (mild)   . Colon polyps   . DM (diabetes mellitus) ([place])    type 2  . Dyspnea   . Hx of adenomatous colonic polyps 10/14/2016  . Hyperlipidemia 2009  . Hypertension   . Obesity    moderate to severe  . Obstructive sleep apnea    he refused to use cpap  . Pelvic floor dysfunction 11/17/2016  . Skin cancer   . TIA (transient ischemic attack)    in Feb of 2004  . Vertigo     Assessment: 78 yo male presents with new CVA. PMH includes HFrEF, HTN, T2DM, OSA (noncompliant with CPAP), HLD,  CKD III, and prior TIA. Patient on apixaban PTA with unknown date of last dose. Pharmacy consulted to start heparin gtt for CVA. H/H 15.5/46.5. Plts 288. No s/sx of bleeding documented.   Goal of Therapy:  Heparin goal 0.3-0.5 Monitor platelets by anticoagulation protocol: Yes   Plan:   No Bolus  Start heparin drip at 1100 units/hr Monitor daily heparin levels, CBCs, and s/sx of bleeding  F/u when to transition to oral anticoagulation   Gwenlyn Found, Sherian Rein D PGY1 Pharmacy Resident  Phone [phone] 01/01/2019   5:15 PM

## 2019-01-01 NOTE — Progress Notes (Signed)
Pt RN can hear some gurgling sounds in pt throat when he's breathing. Pt instructed to cough but not able to cough to clear throat. MD paged and notified about RN concerns. Pt on continue pulse monitoring and stating at 95% and on 2L oxgygen. Will closely monitor pt. Delia Heady RN

## 2019-01-02 ENCOUNTER — Inpatient Hospital Stay (HOSPITAL_COMMUNITY): Payer: Medicare Other

## 2019-01-02 DIAGNOSIS — I63311 Cerebral infarction due to thrombosis of right middle cerebral artery: Secondary | ICD-10-CM

## 2019-01-02 DIAGNOSIS — I158 Other secondary hypertension: Secondary | ICD-10-CM

## 2019-01-02 LAB — BASIC METABOLIC PANEL
Anion gap: 18 — ABNORMAL HIGH (ref 5–15)
BUN: 28 mg/dL — ABNORMAL HIGH (ref 8–23)
CO2: 23 mmol/L (ref 22–32)
Calcium: 8.7 mg/dL — ABNORMAL LOW (ref 8.9–10.3)
Chloride: 103 mmol/L (ref 98–111)
Creatinine, Ser: 1.89 mg/dL — ABNORMAL HIGH (ref 0.61–1.24)
GFR calc Af Amer: 39 mL/min — ABNORMAL LOW (ref >60)
GFR calc non Af Amer: 33 mL/min — ABNORMAL LOW (ref >60)
Glucose, Bld: 106 mg/dL — ABNORMAL HIGH (ref 70–99)
Potassium: 3 mmol/L — ABNORMAL LOW (ref 3.5–5.1)
Sodium: 144 mmol/L (ref 135–145)

## 2019-01-02 LAB — CBC WITH DIFFERENTIAL/PLATELET
Abs Immature Granulocytes: 0.04 10*3/uL (ref 0.00–0.07)
Basophils Absolute: 0.1 10*3/uL (ref 0.0–0.1)
Basophils Relative: 1 %
Eosinophils Absolute: 0.1 10*3/uL (ref 0.0–0.5)
Eosinophils Relative: 1 %
HCT: 51.8 % (ref 39.0–52.0)
Hemoglobin: 16.9 g/dL (ref 13.0–17.0)
Immature Granulocytes: 0 %
Lymphocytes Relative: 12 %
Lymphs Abs: 1.2 10*3/uL (ref 0.7–4.0)
MCH: 27.7 pg (ref 26.0–34.0)
MCHC: 32.6 g/dL (ref 30.0–36.0)
MCV: 84.9 fL (ref 80.0–100.0)
Monocytes Absolute: 0.7 10*3/uL (ref 0.1–1.0)
Monocytes Relative: 7 %
Neutro Abs: 8.1 10*3/uL — ABNORMAL HIGH (ref 1.7–7.7)
Neutrophils Relative %: 79 %
Platelets: 301 10*3/uL (ref 150–400)
RBC: 6.1 MIL/uL — ABNORMAL HIGH (ref 4.22–5.81)
RDW: 13.2 % (ref 11.5–15.5)
WBC: 10.3 10*3/uL (ref 4.0–10.5)
nRBC: 0 % (ref 0.0–0.2)

## 2019-01-02 LAB — APTT: aPTT: 49 seconds — ABNORMAL HIGH (ref 24–36)

## 2019-01-02 LAB — GLUCOSE, CAPILLARY
Glucose-Capillary: 117 mg/dL — ABNORMAL HIGH (ref 70–99)
Glucose-Capillary: 137 mg/dL — ABNORMAL HIGH (ref 70–99)
Glucose-Capillary: 130 mg/dL — ABNORMAL HIGH (ref 70–99)

## 2019-01-02 LAB — HEPARIN LEVEL (UNFRACTIONATED)
Heparin Unfractionated: 0.11 IU/mL — ABNORMAL LOW (ref 0.30–0.70)
Heparin Unfractionated: 0.2 IU/mL — ABNORMAL LOW (ref 0.30–0.70)
Heparin Unfractionated: 0.29 IU/mL — ABNORMAL LOW (ref 0.30–0.70)

## 2019-01-02 MED ORDER — HYDRALAZINE HCL 20 MG/ML IJ SOLN: 5.0000 mg | Freq: Three times a day (TID) | INTRAMUSCULAR | Status: DC

## 2019-01-02 MED ORDER — POTASSIUM CHLORIDE 10 MEQ/100ML IV SOLN
10.0000 meq | INTRAVENOUS | Status: AC
  Administered 2019-01-02 (×2): 10 meq via INTRAVENOUS
  Filled 2019-01-02 (×4): qty 100

## 2019-01-02 MED ORDER — HYDRALAZINE HCL 20 MG/ML IJ SOLN
10.0000 mg | Freq: Three times a day (TID) | INTRAMUSCULAR | Status: DC
  Administered 2019-01-02 – 2019-01-03 (×3): 10 mg via INTRAVENOUS
  Filled 2019-01-02 (×3): qty 1

## 2019-01-02 MED ORDER — FUROSEMIDE 10 MG/ML IJ SOLN: 20.0000 mg | Freq: Every day | INTRAMUSCULAR | Status: DC

## 2019-01-02 MED ORDER — HYDRALAZINE HCL 20 MG/ML IJ SOLN: 10.0000 mg | Freq: Four times a day (QID) | INTRAMUSCULAR | Status: DC

## 2019-01-02 MED ORDER — NITROGLYCERIN 0.2 MG/HR TD PT24
0.2000 mg | MEDICATED_PATCH | Freq: Every day | TRANSDERMAL | Status: DC
  Administered 2019-01-02 – 2019-01-04 (×3): 0.2 mg via TRANSDERMAL
  Filled 2019-01-02 (×3): qty 1

## 2019-01-02 MED ORDER — HALOPERIDOL LACTATE 5 MG/ML IJ SOLN
INTRAMUSCULAR | Status: AC
  Administered 2019-01-02: 06:00:00 1 mg
  Filled 2019-01-02: qty 1

## 2019-01-02 MED ORDER — HYDRALAZINE HCL 20 MG/ML IJ SOLN
5.0000 mg | Freq: Once | INTRAMUSCULAR | Status: AC
  Administered 2019-01-02: 10:00:00 5 mg via INTRAVENOUS
  Filled 2019-01-02: qty 1

## 2019-01-02 MED ORDER — POTASSIUM CHLORIDE CRYS ER 20 MEQ PO TBCR: 40.0000 meq | EXTENDED_RELEASE_TABLET | Freq: Once | ORAL | Status: DC

## 2019-01-02 MED ORDER — HALOPERIDOL LACTATE 5 MG/ML IJ SOLN: 1.0000 mg | INTRAMUSCULAR | Status: DC | PRN

## 2019-01-02 NOTE — Progress Notes (Signed)
Physical Therapy Treatment Patient Details Name: Micheal Holt MRN: [id number] DOB: May 21, 1941 Today's Date: 01/02/2019    History of Present Illness Micheal Holt is a 78 y.o. male presenting with weakness, fall and new CVA. PMH is significant for HFrEF, HTN, T2DM, OSA (noncompliant with CPAP), HLD,  CKD III, and prior TIA. CT: Right parietal hypodensity may represent acute infarct; MRI pending    PT Comments    Pt continues to be lethargic. Was able to be roused enough to participate with cold rag on face. Pt followed commands to wash his face, and grabbed the washcloth from therapist to complete task. Pt also following commands for exercise (AROM on R and PROM on L). Pt kept eyes closed almost the entire session. Will continue to follow and progress as able per POC.     Follow Up Recommendations  SNF;Supervision/Assistance - 24 hour     Equipment Recommendations  Other (comment)(TBD by next venue of care)    Recommendations for Other Services       Precautions / Restrictions Precautions Precautions: Fall Restrictions Weight Bearing Restrictions: No    Mobility  Bed Mobility Overal bed mobility: Needs Assistance Bed Mobility: Rolling Rolling: Total assist         General bed mobility comments: Total assist for scooting and repositioning in bed.   Transfers                 General transfer comment: only sat EOB due severe lethargy  Ambulation/Gait                 Stairs             Wheelchair Mobility    Modified Rankin (Stroke Patients Only) Modified Rankin (Stroke Patients Only) Pre-Morbid Rankin Score: No symptoms Modified Rankin: Severe disability     Balance Overall balance assessment: Needs assistance Sitting-balance support: Bilateral upper extremity supported;Feet supported Sitting balance-Leahy Scale: Zero Sitting balance - Comments: Not able to be tested this session                                     Cognition Arousal/Alertness: Lethargic Behavior During Therapy: Flat affect Overall Cognitive Status: No family/caregiver present to determine baseline cognitive functioning                                 General Comments: Followed simple 1 step commands intermittently.       Exercises General Exercises - Upper Extremity Shoulder Flexion: 5 reps;AROM;Right;PROM;Left General Exercises - Lower Extremity Heel Slides: 5 reps;AROM;Right;PROM;Left    General Comments        Pertinent Vitals/Pain Pain Assessment: Faces Pain Score: 2  Faces Pain Scale: Hurts a little bit Pain Location: intermittent moaning, grimacing Pain Descriptors / Indicators: Grimacing;Moaning Pain Intervention(s): Monitored during session    Home Living                      Prior Function            PT Goals (current goals can now be found in the care plan section) Acute Rehab PT Goals Patient Stated Goal: Minimally verbal - mainly asking for water PT Goal Formulation: With patient Time For Goal Achievement: 01/15/19 Potential to Achieve Goals: Good Progress towards PT goals: Progressing toward goals    Frequency  Min 3X/week      PT Plan Frequency needs to be updated    Co-evaluation   Reason for Co-Treatment: Complexity of the patient's impairments (multi-system involvement);Necessary to address cognition/behavior during functional activity;For patient/therapist safety   OT goals addressed during session: ADL's and self-care(precursors to ADL activity)      AM-PAC PT "6 Clicks" Mobility   Outcome Measure  Help needed turning from your back to your side while in a flat bed without using bedrails?: Total Help needed moving from lying on your back to sitting on the side of a flat bed without using bedrails?: Total Help needed moving to and from a bed to a chair (including a wheelchair)?: Total Help needed standing up from a chair using your arms (e.g.,  wheelchair or bedside chair)?: Total Help needed to walk in hospital room?: Total Help needed climbing 3-5 steps with a railing? : Total 6 Click Score: 6    End of Session Equipment Utilized During Treatment: Oxygen Activity Tolerance: Patient limited by lethargy Patient left: in bed;with call bell/phone within reach Nurse Communication: Mobility status PT Visit Diagnosis: Other symptoms and signs involving the nervous system (R29.898);Hemiplegia and hemiparesis Hemiplegia - Right/Left: Left Hemiplegia - dominant/non-dominant: Non-dominant Hemiplegia - caused by: Cerebral infarction     Time: 1224-1238 PT Time Calculation (min) (ACUTE ONLY): 14 min  Charges:  $Therapeutic Activity: 8-22 mins                     [name], PT, DPT Acute Rehabilitation Services Pager: [phone] Office: [phone]    [name] 01/02/2019, 3:14 PM
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
## 2019-01-02 NOTE — Progress Notes (Signed)
  Speech Language Pathology Treatment: Dysphagia  Patient Details Name: Micheal Holt MRN: [id number] DOB: 02/02/1941 Today's Date: 01/02/2019 Time: 7209-1980 SLP Time Calculation (min) (ACUTE ONLY): 19 min  Assessment / Plan / Recommendation Clinical Impression  Pt was seen for skilled co-tx with OT to maximize arousal to attempt safer PO intake. Despite repositioning to EOB, pt primarily maintained eyes closed. He did respond to noxious stimuli x2. Pt made no attempts to take ice chips from the spoon, but when given a few drops of water he elicited a swallow response. It was significantly delayed from time of presentation and elicited a wet-sounding cough. Further trials were held given high risk for aspiration in light of current mentation. SLP will f/u for additional trials, with recommendation for continued discussion with family regarding GOC. SLP speech-language evaluation was held at this time - will attempt if he becomes more alert.   HPI HPI: 78 year old male admitted 12/30/2018 with left weakness, fall and CVA. PMH: HFrEF, HTN, DM2, OSA, HLD, CKD III, TIA. Head CT = right parietal hypodensity. MRI pending. CXR = Interstitial and alveolar airspace opacities concerning for edema/CHF.       SLP Plan  Continue with current plan of care       Recommendations  Diet recommendations: NPO Medication Administration: Via alternative means                Oral Care Recommendations: Oral care QID Follow up Recommendations: Skilled Nursing facility;24 hour supervision/assistance SLP Visit Diagnosis: Dysphagia, unspecified (R13.10) Plan: Continue with current plan of care       GO                Micheal Holt 01/02/2019, 10:55 AM  Pollyann Glen, M.A. [place] Acute Environmental education officer [phone] Office [phone]

## 2019-01-02 NOTE — Progress Notes (Signed)
A total dose of 2mg  haldol given prior for MRI pt agitated.

## 2019-01-02 NOTE — Plan of Care (Signed)
Progressing towards goals

## 2019-01-02 NOTE — Progress Notes (Signed)
ANTICOAGULATION CONSULT NOTE - Follow-up Consult  Pharmacy Consult for heparin Indication: stroke  No Known Allergies  Patient Measurements: Height: 5\' 10"  (177.8 cm) Weight: 201 lb 11.5 oz (91.5 kg) IBW/kg (Calculated) : 73 HEPARIN DW (KG): 92.3  Vital Signs: Temp: 98.4 F (36.9 C) (04/28 2037) Temp Source: Oral (04/28 2037) BP: 164/117 (04/28 2218) Pulse Rate: 101 (04/28 2218)  Labs: Recent Labs    12/31/18 0422 01/01/19 0609  01/01/19 2311 01/02/19 0347 01/02/19 0746 01/02/19 1440 01/02/19 2230  HGB 15.2 15.5  --   --  16.9  --   --   --   HCT 46.6 46.5  --   --  51.8  --   --   --   PLT 116* 288  --   --  301  --   --   --   APTT  --   --   --  49*  --  49*  --   --   HEPARINUNFRC  --   --    < > 0.14*  --  0.11* 0.20* 0.29*  CREATININE 2.03* 1.96*  --   --  1.89*  --   --   --    < > = values in this interval not displayed.    Estimated Creatinine Clearance: 36.6 mL/min (A) (by C-G formula based on SCr of 1.89 mg/dL (H)).   Assessment: 78 yo male presents with new CVA. PMH includes HFrEF, HTN, T2DM, OSA (noncompliant with CPAP), HLD,  CKD III, and prior TIA. Patient on apixaban PTA with unknown date of last dose. Pharmacy consulted to start heparin gtt for CVA.   01/02/2019 10:59 PM Update: heparin level just below goal after rate increase  Goal of Therapy:  Heparin goal 0.3-0.5 units/ml Monitor platelets by anticoagulation protocol: Yes   Plan:  Increase heparin drip to 1600 units/hr Re-check heparin level with AM labs  Narda Bonds, PharmD, [place] Pharmacist Phone: [phone]

## 2019-01-02 NOTE — Progress Notes (Signed)
Fortunately had a conversation with neurology, Dr. Leonie Man, on patient's clinical status, MRI findings, and his conversation with healthcare POA.  Evaluated patient at bedside around 3:30 PM this afternoon.  Was able to have him face time with his daughter and son.  He was able to speak short understandable sentences and stay intermittently alert for their conversations.  Also smiled and put his thumbs up.  Both appreciated the time and stated they were trying to get flights out to see him in the next 1-2 days (they are mostly hoping for tomorrow, however with COVID is challenging to find a flight).   They have decided they still do not want to move forward with any type of feeding tube.  We will continue to have speech come by as he slowly improves.  At this time they want to make sure he is comfortable without any invasive procedures, however leaning towards keeping medical measures such as antibiotics and hypertensive medications at this time.  Once they are able to see him, plan may change. He is still DNR/DNI.  Our team will continue to have close contact with his daughter, healthcare POA, and son on their plans for visiting and if any changes need to be made.  We will also touch base to allow temporary visitation given patient's clinical condition and out of state travel.  Patriciaann Clan, DO

## 2019-01-02 NOTE — Progress Notes (Signed)
Family Medicine Teaching Service Daily Progress Note Intern Pager: [phone]  Patient name: Micheal Holt Medical record number: [id number] Date of birth: [birth year] Age: 78 y.o. Gender: male  Primary Care Provider: Leonard Downing, MD Consultants: Neuro Code Status: DNR/DNI  Pt Overview and Major Events to Date:  4/26 admitted   Assessment and Plan: Micheal Colee Staffordis a 78 y.o.malepresenting with weakness, fall and new CVA. PMH is significant forHFrEF,HTN,T2DM,OSA (noncompliant with CPAP), HLD, CKD III, and prior TIA  CVA,right MCA ischemic infarct: Acute/subacute, stable. Continued decreased muscle strength on the left, left facial droop with speech difficulty and cheyenne stokes breathing pattern.  MRI/MRA showing acute/subacute large nonhemorrhagic infarct involving right frontal operculum and insular cortex with moderate-severe stenosis of bilateral cavernous ICA.  No significant carotid stenosis on Dopplers. Failed swallow study, considering NG tube placement pending family's request, still questionable whether this would be short-term vs long-term.  PT/OT rec SNF. Likely in the setting of chronic A. fib with inconsistent anticoagulation.  - Neurology following, appreciate further recommendations, will touch base with them today - Aspirin 300 mg suppository -CM, vitals per routine -Start atorvastatin when can p.o. --Call daughter about NG tube, updates  Abnormal EEG:  EEG showing central parietal slowing and infrequent generalized rhythmic delta activity.  Likely in the setting of his CVA as above, however appreciate assistance per neurology on additional recommendations. - Neurology following, appreciate further recommendations  Acute on chronic HFrEF with EF 25-30%:Improving.  On 2L nasal cannula, often times out of his nose.  Laying flat without SOB. -5.9 L since admit. Believe his irregular breathing may be main contributor of SOB appearance on occasion, rather  than fluid overload at this point. Euvolemic on exam.  - Lasix 40 mg IV twice daily >> decrease to home dose 20IV and monitor output  - Strict I and O's, daily weights - Oxygen therapy as needed -Attempt to avoid BiPAP   UTI: Acute.  Endorses dysuria with recent urinary incontinence on admit with > 100,000 colonies of gram-negative rods.  -Continue Ancef IV (4/27-)  - Follow-up susceptibilities  Arial fibrillationwithout MGQ:QPYPPJK, stable.  HR 90-100.  Mali vasc 7.  Has been taking both Eliquis and Xarelto instead of discontinuing Xarelto as instructed, and metoprolol for rate control. - Continue heparin IV per stroke protocol (likely to do Eliquis given concern for appropriate fatty meal with Xarelto in the future)  - Cont IV Lopressor 15 mg every 6  Hypertension: Chronic, poorly controlled.   SBP 160-180s.  Out of permissive hypertension window.  -Cont IV lopressor as above -Start hydralazine IV 5 mg TID, -Increase Nitro-Dur 0.2 mg from 0.1 - Likely restart lisinopril low dose when po   CKD, stageIII:Chronic, stable.  Creatinine 1.89, baseline around 1.9.  - Monitor I and O's - Monitor BMP - Avoid nephrotoxic medications as possible  T2DM:Chronic, stable.   CBG 100's this a.m.  A1c 7.1.  Takes glipizide 5 mg, Jardiance 10 mg, and Ozempic 1 mg weekly at home.  - SSI - CBGs with meals and at bedtime - Consider DC of glipizide at discharge  H/o TIA (2004)  Stenosis of posterior cerebral artery: Chronic, stable. Previously on ASA and Clopidogrel, stopped when patient started Eliquis.  - Start atorvastatin when po   OSA: Chronic, stable.   Noncompliant with CPAP.  -Attempt CPAP nightly  DTO:IZTIWPY, stable. Lipid panel WNL.  Patient is currently not on statin secondary to noncompliance. - Start atorvastatin when po   BPH: Chronic, stable. Patient on  flomax at home daily.  - Continue Flomax  FEN/GI:NPO until passes swallow eval/? NG tube   Prophylaxis:heparin IV   Disposition:Continue work-up  Subjective:  No acute events overnight.  Was able to get the MRI this morning, took 2 mg of Haldol prior to for agitation.  Remain on nasal cannula overnight, agitated by the BiPAP.  Objective: Temp:  [98.3 F (36.8 C)-98.9 F (37.2 C)] 98.4 F (36.9 C) (04/28 0342) Pulse Rate:  [73-95] 88 (04/28 0342) Resp:  [19-27] 19 (04/28 0342) BP: (169-203)/(116-144) 169/136 (04/28 0342) SpO2:  [90 %-99 %] 98 % (04/28 0342) FiO2 (%):  [28 %] 28 % (04/27 0092) Physical Exam: General: Older tired gentleman, intermittently alert  HEENT: NCAT, MM dry   Cardiac: Irregular rate, no murmurs gallops, or rubs noted Lungs: Relatively clear bilaterally with some rhonchi, no increased work of breathing, however with Cheyne-Stokes breathing pattern Abdomen: soft, non-distended, normoactive BS Neuro: Intermittently alert.  PERRLA.  Able to follow most commands by performing and nodding his head yes or no, tries to speak however difficult to understand.  Preserved muscle strength in the right upper/lower extremity.  Smile not symmetrical, droop on the left.  Able to move LLE spontaneously.  Will not move his left upper arm.  Sensation intact to distal bilateral extremities. Ext: Warm, dry, 2+ distal pulses, no edema   Laboratory: Recent Labs  Lab 12/31/18 0422 01/01/19 0609 01/02/19 0347  WBC 12.5* 11.9* 10.3  HGB 15.2 15.5 16.9  HCT 46.6 46.5 51.8  PLT 116* 288 301   Recent Labs  Lab 12/30/18 2003 12/31/18 0422 01/01/19 0609 01/02/19 0347  NA 138 140 143 144  K 3.8 4.3 3.2* 3.0*  CL 105 107 108 103  CO2 17* 16* 23 23  BUN 19 21 23  28*  CREATININE 1.90* 2.03* 1.96* 1.89*  CALCIUM 8.9 8.8* 8.5* 8.7*  PROT 6.4*  --   --   --   BILITOT 1.4*  --   --   --   ALKPHOS 89  --   --   --   ALT 12  --   --   --   AST 21  --   --   --   GLUCOSE 242* 244* 134* 106*    Imaging/Diagnostic Tests: Vas US Carotid  Result Date:  01/01/2019 Carotid Arterial Duplex Study Indications: CVA. Performing Technologist: Toma Copier RVS  Examination Guidelines: A complete evaluation includes B-mode imaging, spectral Doppler, color Doppler, and power Doppler as needed of all accessible portions of each vessel. Bilateral testing is considered an integral part of a complete examination. Limited examinations for reoccurring indications may be performed as noted.  Right Carotid Findings: +----------+--------+--------+--------+--------+--------------------+           PSV cm/sEDV cm/sStenosisDescribeComments             +----------+--------+--------+--------+--------+--------------------+ CCA Prox  62      10                                           +----------+--------+--------+--------+--------+--------------------+ CCA Distal64      5                       mild intimal changes +----------+--------+--------+--------+--------+--------------------+ ICA Prox  42      12  mild intimal changes +----------+--------+--------+--------+--------+--------------------+ ICA Mid   50      13                                           +----------+--------+--------+--------+--------+--------------------+ ICA Distal43      16                                           +----------+--------+--------+--------+--------+--------------------+ ECA       91      6                                            +----------+--------+--------+--------+--------+--------------------+ +----------+--------+-------+--------+-------------------+           PSV cm/sEDV cmsDescribeArm Pressure (mmHG) +----------+--------+-------+--------+-------------------+ [id number]                                         +----------+--------+-------+--------+-------------------+ +---------+--------+--+--------+-+ VertebralPSV cm/s34EDV cm/s5 +---------+--------+--+--------+-+  Left Carotid Findings:  +----------+--------+--------+--------+--------+-------------------------+           PSV cm/sEDV cm/sStenosisDescribeComments                  +----------+--------+--------+--------+--------+-------------------------+ CCA Prox  56      5                       mild intimal wall changes +----------+--------+--------+--------+--------+-------------------------+ CCA Distal56      13                      mild intimal changes      +----------+--------+--------+--------+--------+-------------------------+ ICA Prox  46      10                      mild intimal changes      +----------+--------+--------+--------+--------+-------------------------+ ICA Mid   32      8                       mild intimal changes      +----------+--------+--------+--------+--------+-------------------------+ ICA Distal53      22                                                +----------+--------+--------+--------+--------+-------------------------+ ECA       56      5                       mild intimal changes      +----------+--------+--------+--------+--------+-------------------------+ +----------+--------+--------+--------+-------------------+ SubclavianPSV cm/sEDV cm/sDescribeArm Pressure (mmHG) +----------+--------+--------+--------+-------------------+           85                                          +----------+--------+--------+--------+-------------------+ +---------+--------+--+--------+-+ VertebralPSV cm/s27EDV cm/s7 +---------+--------+--+--------+-+  Summary: Right Carotid: There is no evidence of  stenosis in the right ICA. Left Carotid: There is no evidence of stenosis in the left ICA. Vertebrals:  Bilateral vertebral arteries demonstrate antegrade flow. Subclavians: Normal flow hemodynamics were seen in bilateral subclavian              arteries. *See table(s) above for measurements and observations.     Preliminary     Patriciaann Clan, DO 01/02/2019,  7:22 AM PGY-1, [place] Intern pager: [phone], text pages welcome

## 2019-01-02 NOTE — Progress Notes (Addendum)
ANTICOAGULATION CONSULT NOTE - Follow-up Consult  Pharmacy Consult for heparin Indication: stroke  No Known Allergies  Patient Measurements: Height: 5\' 10"  (177.8 cm) Weight: 201 lb 11.5 oz (91.5 kg) IBW/kg (Calculated) : 73 HEPARIN DW (KG): 92.3  Vital Signs: Temp: 98.4 F (36.9 C) (04/28 0700) Temp Source: Temporal (04/28 0700) BP: 168/32 (04/28 0700) Pulse Rate: 80 (04/28 0700)  Labs: Recent Labs    12/30/18 2003 12/31/18 0422 01/01/19 0609 01/01/19 2311 01/02/19 0347 01/02/19 0746  HGB 16.3 15.2 15.5  --  16.9  --   HCT 49.6 46.6 46.5  --  51.8  --   PLT 305 116* 288  --  301  --   APTT 28  --   --  49*  --  49*  LABPROT 14.7  --   --   --   --   --   INR 1.2  --   --   --   --   --   HEPARINUNFRC  --   --   --  0.14*  --  0.11*  CREATININE 1.90* 2.03* 1.96*  --  1.89*  --   CKTOTAL 393  --   --   --   --   --     Estimated Creatinine Clearance: 36.6 mL/min (A) (by C-G formula based on SCr of 1.89 mg/dL (H)).   Assessment: 78 yo male presents with new CVA. PMH includes HFrEF, HTN, T2DM, OSA (noncompliant with CPAP), HLD,  CKD III, and prior TIA. Patient on apixaban PTA with unknown date of last dose. Pharmacy consulted to start heparin gtt for CVA.   4/28 AM: Heparin level and aPTT subtherapeutic at 0.11 and 49 seconds, respectively. Heparin infusion at 1300 units/hr. No longer needs aPTT monitoring, now that correlating. Hgb 16.9 Pltc 301, stable.   Spoke with RN and she did not get report that infusion was off, however pt went for MRI this morning, so infusion was likely stopped for an undetermined amount of time. Will continue at same rate and check 6 hour level.  Goal of Therapy:  Heparin goal 0.3-0.5 units/ml Monitor platelets by anticoagulation protocol: Yes   Plan:  Continue heparin drip to 1300 units/hr Will f/u heparin level today at 1400 Monitor daily heparin levels, CBCs, and s/sx of bleeding  F/u when to transition to oral anticoagulation    Thank you for involving pharmacy in this patient's care.  Janae Bridgeman, PharmD PGY1 Pharmacy Resident Phone: [phone] 01/02/2019 8:34 AM

## 2019-01-02 NOTE — Progress Notes (Addendum)
ANTICOAGULATION CONSULT NOTE - Follow-up Consult  Pharmacy Consult for heparin Indication: stroke  No Known Allergies  Patient Measurements: Height: 5\' 10"  (177.8 cm) Weight: 201 lb 11.5 oz (91.5 kg) IBW/kg (Calculated) : 73 HEPARIN DW (KG): 92.3  Vital Signs: Temp: 98.7 F (37.1 C) (04/28 1140) Temp Source: Axillary (04/28 1140) BP: 193/128 (04/28 1140) Pulse Rate: 90 (04/28 1140)  Labs: Recent Labs    12/30/18 2003 12/31/18 0422 01/01/19 0609 01/01/19 2311 01/02/19 0347 01/02/19 0746  HGB 16.3 15.2 15.5  --  16.9  --   HCT 49.6 46.6 46.5  --  51.8  --   PLT 305 116* 288  --  301  --   APTT 28  --   --  49*  --  49*  LABPROT 14.7  --   --   --   --   --   INR 1.2  --   --   --   --   --   HEPARINUNFRC  --   --   --  0.14*  --  0.11*  CREATININE 1.90* 2.03* 1.96*  --  1.89*  --   CKTOTAL 393  --   --   --   --   --     Estimated Creatinine Clearance: 36.6 mL/min (A) (by C-G formula based on SCr of 1.89 mg/dL (H)).   Assessment: 78 yo male presents with new CVA. PMH includes HFrEF, HTN, T2DM, OSA (noncompliant with CPAP), HLD,  CKD III, and prior TIA. Patient on apixaban PTA with unknown date of last dose. Pharmacy consulted to start heparin gtt for CVA.   4/28 PM: Heparin level subtherapeutic at 0.2. Heparin infusion currently at 1300 units/hr. Hgb 16.9 Pltc 301, stable. No infusion issues or bleeding noted.  Goal of Therapy:  Heparin goal 0.3-0.5 units/ml Monitor platelets by anticoagulation protocol: Yes   Plan:  Increase heparin drip to 1500 units/hr Heparin level at 2230 Monitor daily heparin levels, CBCs, and s/sx of bleeding  F/u when to transition to oral anticoagulation   Thank you for involving pharmacy in this patient's care.  Janae Bridgeman, PharmD PGY1 Pharmacy Resident Phone: [phone] 01/02/2019 2:59 PM

## 2019-01-02 NOTE — Progress Notes (Signed)
CBG 127 

## 2019-01-02 NOTE — Progress Notes (Signed)
STROKE TEAM PROGRESS NOTE   SUBJECTIVE (INTERVAL HISTORY) The patient remains severely dysarthric with dense left hemiplegia and he did not pass a swallow eval.  I spoke to the patient's daughter over the phone who is in [place].  Patient has advanced directives which state that he does not want a feeding tube.  Family is clear about his wishes and are leaning towards comfort care measures only.  OBJECTIVE Vitals:   01/02/19 0342 01/02/19 0700 01/02/19 1016 01/02/19 1140  BP: (!) 169/136 (!) 168/32 (!) 156/116 (!) 193/128  Pulse: 88 80 85 90  Resp: 19 20 20  (!) 25  Temp: 98.4 F (36.9 C) 98.4 F (36.9 C)  98.7 F (37.1 C)  TempSrc: Oral Temporal  Axillary  SpO2: 98% 92%  99%  Weight:      Height:        CBC:  Recent Labs  Lab 01/01/19 0609 01/02/19 0347  WBC 11.9* 10.3  NEUTROABS 9.8* 8.1*  HGB 15.5 16.9  HCT 46.5 51.8  MCV 85.0 84.9  PLT 288 737    Basic Metabolic Panel:  Recent Labs  Lab 01/01/19 0609 01/02/19 0347  NA 143 144  K 3.2* 3.0*  CL 108 103  CO2 23 23  GLUCOSE 134* 106*  BUN 23 28*  CREATININE 1.96* 1.89*  CALCIUM 8.5* 8.7*    Lipid Panel:     Component Value Date/Time   CHOL 152 12/31/2018 0422   CHOL 176 09/11/2018 1452   TRIG 55 12/31/2018 0422   HDL 48 12/31/2018 0422   HDL 38 (L) 09/11/2018 1452   CHOLHDL 3.2 12/31/2018 0422   VLDL 11 12/31/2018 0422   LDLCALC 93 12/31/2018 0422   LDLCALC 94 09/11/2018 1452   HgbA1c:  Lab Results  Component Value Date   HGBA1C 7.1 (H) 12/31/2018   Urine Drug Screen:     Component Value Date/Time   LABOPIA NONE DETECTED 03/16/2016 1650   COCAINSCRNUR NONE DETECTED 03/16/2016 1650   LABBENZ NONE DETECTED 03/16/2016 1650   AMPHETMU NONE DETECTED 03/16/2016 1650   THCU NONE DETECTED 03/16/2016 1650   LABBARB NONE DETECTED 03/16/2016 1650    Alcohol Level     Component Value Date/Time   ETH <10 07/05/2017 1149    IMAGING Ct Head Wo Contrast Ct Cervical Spine Wo Contrast Ct  Maxillofacial Wo Contrast 12/30/2018 1. Right parietal hypodensity may represent acute infarct. MRI would be necessary to confirm.  2. Atrophy and ventricular enlargement is stable. Chronic ischemic changes are present bilaterally. No intracranial hemorrhage  3. Negative for facial fracture  4. Negative for cervical spine fracture.   MRI / MRA Brain  01/02/2019 1. Acute/subacute large nonhemorrhagic infarct involving the right frontal operculum and insular cortex. 2. No flow evident in the M1 segments at the bifurcations on either side. This is in part artifactual. Distal right M1 segment stenosis or occlusion is suspected. 3. Diffuse atherosclerotic changes in the cavernous internal carotid arteries and at the basilar artery. 4. Moderate diffuse atrophy and white matter disease reflects the sequela of chronic microvascular ischemia.    Dg Chest Port 1 View 12/30/2018 Interstitial and alveolar airspace opacities concerning for edema/CHF.   Transthoracic Echocardiogram  Right Carotid: There is no evidence of stenosis in the right ICA. Left Carotid: There is no evidence of stenosis in the left ICA. Vertebrals:  Bilateral vertebral arteries demonstrate antegrade flow. Subclavians: Normal flow hemodynamics were seen in bilateral subclavian arteries.  Bilateral Carotid Dopplers  12/31/2018  1. The left  ventricle has severely reduced systolic function, with an ejection fraction of 25-30%. The cavity size was normal. Left ventricular diastolic Doppler parameters are consistent with indeterminate diastolic dysfunction. Left ventricular  diffuse hypokinesis.  2. Left atrial size was moderately dilated.  3. Aortic valve regurgitation is trivial by color flow Doppler.  4. Mitral valve regurgitation is mild to moderate by color flow Doppler.  5. No intracardiac thrombi or masses were visualized. Definity was used for LV endocardial border enhancement, no evidence of apical or mural thrombus noted.  EKG  - atrial fibrillation - ventricular response 112 BPM (See cardiology reading for complete details)  EEG 1.  Focal central parietal slowing on the right side which can be seen from focal cerebral disturbance and/or epileptic focus. 2.  Infrequent generalized rhythmic delta activity with frontocentral maximum typically seen in toxic metabolic processes.   PHYSICAL EXAM: Elderly Caucasian male in mild respiratory distress. . Afebrile. Head is nontraumatic. Neck is supple without bruit.    Cardiac exam no murmur or gallop. Lungs are clear to auscultation. Distal pulses are well felt. Blood pressure (!) 193/128, pulse 90, temperature 98.7 F (37.1 C), temperature source Axillary, resp. rate (!) 25, height 5\' 10"  (1.778 m), weight 91.5 kg, SpO2 99 %. Gen: NAD, Russell/AT, on bipap                 Neuro: Drowsy but can be easily aroused.  Severely dysarthric can barely be understood.  Will follow simple commands.  Right gaze preference unable to look to the left past midline.  Blinks to threat on the right but not on the left.  Able to look up and down.  Severe left lower facial weakness.  Tongue midline.  Weak cough and gag.  Dense left hemiplegia but will withdraw partially to painful stimulus.  Purposeful antigravity strength on the right side.    ASSESSMENT/PLAN [name] is a 78 y.o. male with history of chronic diastolic CHF, atrial fibrillation, prior TIA, OSA, HTN, DM, CKD and HLD presenting with a suspected fall (found down prone at home), elevated BP and left sided weakness. He did not receive IV t-PA due to late presentation.  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  MRI / MRA head - acute/subacute R frontal and insular infarct. Distal R M1 occlusion suspected. Diffuse atherosclerosis B ICA and BA. Mod atrophy and SVD.  Carotid Doppler - B ICA 1-39% stenosis,  VAs antegrade   2D Echo - EF 25-30%. LA dilated. No source of embolus   Corona virus - negative  EEG no sz, findings w/o stroke  LDL - 93  HgbA1c - 7.1  UDS - not performed  VTE prophylaxis - IV heparin  Eliquis and/or Xarelto prior to admission, now on aspirin 300 mg suppository daily and heparin IV. Plan [place] once can swallow  Therapy recommendations:  SNF  Disposition:  Pending  Atrial Fibrillation w/o RVR  Home anticoagulation:  Eliquis/Xarelto   Now on IV heparin  Plan OAC once can swallow   Hypertension  Blood pressure elevated but within post stroke parameters  Concern on full dose lovenox and bleeding risk. Discussed w/ Dr. Leonie Man who will d/c . Permissive hypertension (OK if < 220/120) but gradually normalize in 5-7 days . Long-term BP goal normotensive  Hyperlipidemia  Lipid lowering medication PTA: none  LDL93, goal < 70  Current lipid lowering medication: Lipitor 40 mg daily (on hold as NPO)  Continue statin at discharge  Diabetes  HgbA1c 7.1, goal < 7.0  Uncontrolled  Dysphagia  Failed swallow  NPO  ? TF, ? PEG. To discuss with family  Other Stroke Risk Factors  Advanced age  Over weight, Body mass index is 28.94 kg/m., recommend weight loss, diet and exercise as appropriate   TIA hx 2004  Family hx stroke (mother)  Obstructive sleep apnea, noncompliant w/ CPAP  Acute on chronic CHF e/ EF 25-30%  Other Active Problems  Medical non compliance   UTI, acute. Leukocytosis - 15.7->12.5->10.3 (afebrile)  Mild thrombocytopenia, resolved  CKD stage III  Lactic acidosis  BPH  Hospital day # 3    He presented with right MCA infarct secondary to atrial fibrillation and not being on anticoagulation.  Patient has significant dysarthria dysphasia and left hemiplegia and likely major disability and will unable to live without assistance and requiring prolonged nutritional support and rehab.  Patient apparently has a advanced  directive but states he does not want feeding tube and unfortunately he will not be able to survive without at least a temporary 1.  The patient's daughter understands and is leaning towards comfort care measures.  I recommend patient's daughter and son see him via face time or alternative video link before they make a final decision.  Discussed with Dr. Darrelyn Hillock, MD.  Greater than 50% time during this 25-minute visit was spent on counseling and coordination of care about his embolic stroke and discussion about evaluation and treatment plan  Antony Contras, MD Medical Director [place] Pager: [phone] 01/02/2019 2:11 PM   To contact Stroke Continuity provider, please refer to [email or web address] After hours, contact General Neurology

## 2019-01-02 NOTE — Progress Notes (Signed)
Occupational Therapy Treatment Patient Details Name: Micheal Holt MRN: [id number] DOB: 1941-07-14 Today's Date: 01/02/2019    History of present illness Micheal Holt is a 78 y.o. male presenting with weakness, fall and new CVA. PMH is significant for HFrEF, HTN, T2DM, OSA (noncompliant with CPAP), HLD,  CKD III, and prior TIA. CT: Right parietal hypodensity may represent acute infarct; MRI pending   OT comments  PT seen for cotx with OT and ST today (ST attempting swallowing eval).  Assisted pt to EOB (total a x2) in an attempt to further alert pt. Pt only opened one eye once during session. Pt did at times attempt to push therapists' hands away and did withdraw inconsistently to noxious stimuli.     Follow Up Recommendations  SNF;Supervision/Assistance - 24 hour    Equipment Recommendations  Other (comment)(TBD next venue)    Recommendations for Other Services      Precautions / Restrictions Precautions Precautions: Fall Restrictions Weight Bearing Restrictions: No       Mobility Bed Mobility Overal bed mobility: Needs Assistance Bed Mobility: Rolling Rolling: Total assist;+2 for safety/equipment         General bed mobility comments: Total assist x 2 for supine to sit using bed pad  Transfers                 General transfer comment: only sat EOB due severe lethargy    Balance Overall balance assessment: Needs assistance Sitting-balance support: Bilateral upper extremity supported;Feet supported Sitting balance-Leahy Scale: Zero                                     ADL either performed or assessed with clinical judgement   ADL Overall ADL's : Needs assistance/impaired     Grooming: Wash/dry face;Total assistance                                       Vision Baseline Vision/History: (Pt only opened 1 eye once during session ) Vision Assessment?: Vision impaired- to be further tested in functional context    Perception     Praxis      Cognition Arousal/Alertness: Lethargic(pt only opened 1 eye once during session)   Overall Cognitive Status: No family/caregiver present to determine baseline cognitive functioning                                 General Comments: Pt did not follow any one step commands. Pt did inconsistently pull away from noxious stimuli and did at times attempt to push therapists' hands away        Exercises     Shoulder Instructions       General Comments      Pertinent Vitals/ Pain       Pain Assessment: Faces Pain Score: 2  Faces Pain Scale: Hurts a little bit Pain Location: intermittent moaning, grimacing Pain Descriptors / Indicators: Grimacing;Moaning Pain Intervention(s): Monitored during session  Home Living                                          Prior Functioning/Environment  Frequency  Min 2X/week        Progress Toward Goals  OT Goals(current goals can now be found in the care plan section)  Progress towards OT goals: OT to reassess next treatment  Acute Rehab OT Goals Patient Stated Goal: Pt non verbal this session. Pt was not as interactive today OT Goal Formulation: Patient unable to participate in goal setting Time For Goal Achievement: 01/15/19 Potential to Achieve Goals: [place] Discharge plan remains appropriate    Co-evaluation    PT/OT/SLP Co-Evaluation/Treatment: Yes Reason for Co-Treatment: Complexity of the patient's impairments (multi-system involvement);Necessary to address cognition/behavior during functional activity;For patient/therapist safety   OT goals addressed during session: ADL's and self-care(precursors to ADL activity) SLP goals addressed during session: Swallowing    AM-PAC OT "6 Clicks" Daily Activity     Outcome Measure   Help from another person eating meals?: Total Help from another person taking care of personal grooming?: Total Help from  another person toileting, which includes using toliet, bedpan, or urinal?: Total Help from another person bathing (including washing, rinsing, drying)?: Total Help from another person to put on and taking off regular upper body clothing?: Total Help from another person to put on and taking off regular lower body clothing?: Total 6 Click Score: 6    End of Session    OT Visit Diagnosis: Other abnormalities of gait and mobility (R26.89);Muscle weakness (generalized) (M62.81);Hemiplegia and hemiparesis;Other symptoms and signs involving cognitive function;Cognitive communication deficit (R41.841);Pain Hemiplegia - Right/Left: Left Hemiplegia - dominant/non-dominant: (unknown did consistently use RUE to push therapists hand away) Hemiplegia - caused by: Cerebral infarction   Activity Tolerance Patient limited by lethargy   Patient Left in bed;with call bell/phone within reach;with bed alarm set   Nurse Communication          Time: [phone] OT Time Calculation (min): 19 min  Charges: OT General Charges $OT Visit: 1 Visit OT Treatments $Self Care/Home Management : 8-22 mins(cotx with ST)     Quay Burow, OTR/L 01/02/2019, 11:51 AM

## 2019-01-03 ENCOUNTER — Inpatient Hospital Stay (HOSPITAL_COMMUNITY): Payer: Medicare Other

## 2019-01-03 DIAGNOSIS — I63411 Cerebral infarction due to embolism of right middle cerebral artery: Secondary | ICD-10-CM

## 2019-01-03 DIAGNOSIS — R1314 Dysphagia, pharyngoesophageal phase: Secondary | ICD-10-CM

## 2019-01-03 DIAGNOSIS — Z7189 Other specified counseling: Secondary | ICD-10-CM

## 2019-01-03 DIAGNOSIS — Z515 Encounter for palliative care: Secondary | ICD-10-CM

## 2019-01-03 LAB — CBC
HCT: 48.6 % (ref 39.0–52.0)
Hemoglobin: 16.3 g/dL (ref 13.0–17.0)
MCH: 28.5 pg (ref 26.0–34.0)
MCHC: 33.5 g/dL (ref 30.0–36.0)
MCV: 85.1 fL (ref 80.0–100.0)
Platelets: 312 10*3/uL (ref 150–400)
RBC: 5.71 MIL/uL (ref 4.22–5.81)
RDW: 13.3 % (ref 11.5–15.5)
WBC: 9.4 10*3/uL (ref 4.0–10.5)
nRBC: 0 % (ref 0.0–0.2)

## 2019-01-03 LAB — BASIC METABOLIC PANEL
Anion gap: 17 — ABNORMAL HIGH (ref 5–15)
Anion gap: 14 (ref 5–15)
BUN: 39 mg/dL — ABNORMAL HIGH (ref 8–23)
BUN: 42 mg/dL — ABNORMAL HIGH (ref 8–23)
CO2: 24 mmol/L (ref 22–32)
CO2: 23 mmol/L (ref 22–32)
Calcium: 8.8 mg/dL — ABNORMAL LOW (ref 8.9–10.3)
Calcium: 8.7 mg/dL — ABNORMAL LOW (ref 8.9–10.3)
Chloride: 105 mmol/L (ref 98–111)
Chloride: 109 mmol/L (ref 98–111)
Creatinine, Ser: 1.94 mg/dL — ABNORMAL HIGH (ref 0.61–1.24)
Creatinine, Ser: 2.02 mg/dL — ABNORMAL HIGH (ref 0.61–1.24)
GFR calc Af Amer: 37 mL/min — ABNORMAL LOW (ref >60)
GFR calc Af Amer: 36 mL/min — ABNORMAL LOW (ref >60)
GFR calc non Af Amer: 32 mL/min — ABNORMAL LOW (ref >60)
GFR calc non Af Amer: 31 mL/min — ABNORMAL LOW (ref >60)
Glucose, Bld: 135 mg/dL — ABNORMAL HIGH (ref 70–99)
Glucose, Bld: 117 mg/dL — ABNORMAL HIGH (ref 70–99)
Potassium: 2.7 mmol/L — CL (ref 3.5–5.1)
Potassium: 4 mmol/L (ref 3.5–5.1)
Sodium: 146 mmol/L — ABNORMAL HIGH (ref 135–145)
Sodium: 146 mmol/L — ABNORMAL HIGH (ref 135–145)

## 2019-01-03 LAB — GLUCOSE, CAPILLARY
Glucose-Capillary: 107 mg/dL — ABNORMAL HIGH (ref 70–99)
Glucose-Capillary: 109 mg/dL — ABNORMAL HIGH (ref 70–99)
Glucose-Capillary: 125 mg/dL — ABNORMAL HIGH (ref 70–99)
Glucose-Capillary: 140 mg/dL — ABNORMAL HIGH (ref 70–99)
Glucose-Capillary: 152 mg/dL — ABNORMAL HIGH (ref 70–99)
Glucose-Capillary: 158 mg/dL — ABNORMAL HIGH (ref 70–99)
Glucose-Capillary: 96 mg/dL (ref 70–99)

## 2019-01-03 LAB — URINE CULTURE: Culture: >=100000 — AB

## 2019-01-03 LAB — HEPARIN LEVEL (UNFRACTIONATED)
Heparin Unfractionated: 0.37 IU/mL (ref 0.30–0.70)
Heparin Unfractionated: 0.58 IU/mL (ref 0.30–0.70)

## 2019-01-03 MED ORDER — POTASSIUM CHLORIDE 10 MEQ/100ML IV SOLN
10.0000 meq | INTRAVENOUS | Status: AC
  Administered 2019-01-03 (×6): 10 meq via INTRAVENOUS
  Filled 2019-01-03 (×10): qty 100

## 2019-01-03 MED ORDER — HALOPERIDOL LACTATE 5 MG/ML IJ SOLN: 1.0000 mg | Freq: Once | INTRAMUSCULAR | Status: DC

## 2019-01-03 MED ORDER — FUROSEMIDE 10 MG/ML IJ SOLN: 20.0000 mg | Freq: Once | INTRAMUSCULAR | Status: DC

## 2019-01-03 MED ORDER — CHLORHEXIDINE GLUCONATE 0.12 % MT SOLN
15.0000 mL | Freq: Two times a day (BID) | OROMUCOSAL | Status: DC
  Administered 2019-01-03 – 2019-01-04 (×3): 15 mL via OROMUCOSAL
  Filled 2019-01-03 (×3): qty 15

## 2019-01-03 MED ORDER — HALOPERIDOL LACTATE 5 MG/ML IJ SOLN
1.0000 mg | Freq: Once | INTRAMUSCULAR | Status: AC
  Administered 2019-01-03: 1 mg via INTRAMUSCULAR
  Filled 2019-01-03: qty 1

## 2019-01-03 MED ORDER — HALOPERIDOL LACTATE 2 MG/ML PO CONC
0.5000 mg | ORAL | Status: DC | PRN
  Filled 2019-01-03: qty 0.3

## 2019-01-03 MED ORDER — MORPHINE SULFATE (PF) 2 MG/ML IV SOLN: 1.0000 mg | INTRAVENOUS | Status: DC | PRN

## 2019-01-03 MED ORDER — ORAL CARE MOUTH RINSE
15.0000 mL | Freq: Two times a day (BID) | OROMUCOSAL | Status: DC
  Administered 2019-01-03 – 2019-01-04 (×4): 15 mL via OROMUCOSAL

## 2019-01-03 MED ORDER — HALOPERIDOL LACTATE 5 MG/ML IJ SOLN
0.5000 mg | INTRAMUSCULAR | Status: DC | PRN
  Administered 2019-01-03: 0.5 mg via INTRAVENOUS
  Filled 2019-01-03: qty 1

## 2019-01-03 MED ORDER — HALOPERIDOL 0.5 MG PO TABS
0.5000 mg | ORAL_TABLET | ORAL | Status: DC | PRN
  Filled 2019-01-03: qty 1

## 2019-01-03 MED ORDER — FUROSEMIDE 10 MG/ML IJ SOLN
20.0000 mg | Freq: Every day | INTRAMUSCULAR | Status: DC
  Administered 2019-01-03 – 2019-01-04 (×2): 20 mg via INTRAVENOUS
  Filled 2019-01-03 (×2): qty 4

## 2019-01-03 MED ORDER — HYDRALAZINE HCL 20 MG/ML IJ SOLN
20.0000 mg | Freq: Three times a day (TID) | INTRAMUSCULAR | Status: DC
  Administered 2019-01-03 – 2019-01-04 (×3): 20 mg via INTRAVENOUS
  Filled 2019-01-03 (×3): qty 1

## 2019-01-03 MED ORDER — POTASSIUM CHLORIDE IN NACL 40-0.9 MEQ/L-% IV SOLN
INTRAVENOUS | Status: DC
  Administered 2019-01-03: 75 mL/h via INTRAVENOUS
  Filled 2019-01-03: qty 1000

## 2019-01-03 NOTE — Progress Notes (Signed)
CRITICAL VALUE ALERT  Critical Value:  2.7  Date & Time Notied:  01/03/604  Provider Notified: Maudie Mercury   Orders Received/Actions taken:  See new orders

## 2019-01-03 NOTE — Progress Notes (Signed)
ANTICOAGULATION CONSULT NOTE - Follow-up Consult  Pharmacy Consult for heparin Indication: stroke  No Known Allergies  Patient Measurements: Height: 5\' 10"  (177.8 cm) Weight: 201 lb 11.5 oz (91.5 kg) IBW/kg (Calculated) : 73 HEPARIN DW (KG): 92.3  Vital Signs: Temp: 97.8 F (36.6 C) (04/29 1122) Temp Source: Axillary (04/29 1122) BP: 153/100 (04/29 1122) Pulse Rate: 83 (04/29 1122)  Labs: Recent Labs    01/01/19 0609  01/01/19 2311 01/02/19 0347 01/02/19 0746  01/02/19 2230 01/03/19 0432 01/03/19 1230  HGB 15.5  --   --  16.9  --   --   --  16.3  --   HCT 46.5  --   --  51.8  --   --   --  48.6  --   PLT 288  --   --  301  --   --   --  312  --   APTT  --   --  49*  --  49*  --   --   --   --   HEPARINUNFRC  --    < > 0.14*  --  0.11*   < > 0.29* 0.37 0.58  CREATININE 1.96*  --   --  1.89*  --   --   --  1.94*  --    < > = values in this interval not displayed.    Estimated Creatinine Clearance: 35.7 mL/min (A) (by C-G formula based on SCr of 1.94 mg/dL (H)).   Assessment: 78 yo male presents with new CVA. PMH includes HFrEF, HTN, T2DM, OSA (noncompliant with CPAP), HLD,  CKD III, and prior TIA. Patient on apixaban PTA with unknown date of last dose. Pharmacy consulted to start heparin gtt for CVA.   4/29 PM update: Confirmatory heparin level now supratherapeutic at 0.58 with goal of 0.3-0.5. Uncertain why this level bumped to this degree, but I will slightly decrease infusion rate and confirm with a re-check. No infusion issues per RN. Patient at barium swallow so RN will decrease his infusion rate when he returns.   Goal of Therapy:  Heparin goal 0.3-0.5 units/ml Monitor platelets by anticoagulation protocol: Yes   Plan:  Decrease heparin infusion to 1550 units/hr Confirmatory heparin level at 2200  Monitor daily heparin levels, CBCs, and s/sx of bleeding  F/u on transition to oral anticoagulation   Thank you for involving pharmacy in this patient's  care.  Janae Bridgeman, PharmD PGY1 Pharmacy Resident Phone: [phone] 01/03/2019 1:55 PM

## 2019-01-03 NOTE — Plan of Care (Signed)
?  Problem: Clinical Measurements: ?Goal: Ability to maintain clinical measurements within normal limits will improve ?Outcome: Not Progressing ?  ?

## 2019-01-03 NOTE — Progress Notes (Signed)
Pt has been c/o for water to drink all night need SLP evaluation . Refusing BIPAP and pulling off medical devices. MD made aware Prn Haldol given with little effect. K+ 2.7 Md made aware ,  New orders given.

## 2019-01-03 NOTE — Progress Notes (Signed)
ANTICOAGULATION CONSULT NOTE - Follow-up Consult  Pharmacy Consult for heparin Indication: stroke  No Known Allergies  Patient Measurements: Height: 5\' 10"  (177.8 cm) Weight: 201 lb 11.5 oz (91.5 kg) IBW/kg (Calculated) : 73 HEPARIN DW (KG): 92.3  Vital Signs: Temp: 98.2 F (36.8 C) (04/29 0446) Temp Source: Oral (04/29 0446) BP: 155/87 (04/29 0446) Pulse Rate: 106 (04/29 0446)  Labs: Recent Labs    01/01/19 0609  01/01/19 2311 01/02/19 0347 01/02/19 0746 01/02/19 1440 01/02/19 2230 01/03/19 0432  HGB 15.5  --   --  16.9  --   --   --  16.3  HCT 46.5  --   --  51.8  --   --   --  48.6  PLT 288  --   --  301  --   --   --  312  APTT  --   --  49*  --  49*  --   --   --   HEPARINUNFRC  --    < > 0.14*  --  0.11* 0.20* 0.29* 0.37  CREATININE 1.96*  --   --  1.89*  --   --   --  1.94*   < > = values in this interval not displayed.    Estimated Creatinine Clearance: 35.7 mL/min (A) (by C-G formula based on SCr of 1.94 mg/dL (H)).   Assessment: 78 yo male presents with new CVA. PMH includes HFrEF, HTN, T2DM, OSA (noncompliant with CPAP), HLD,  CKD III, and prior TIA. Patient on apixaban PTA with unknown date of last dose. Pharmacy consulted to start heparin gtt for CVA.   4/29 AM: Heparin level now therapeutic at 0.37. Heparin infusion currently at 1600 units/hr. Hgb 16.3 Pltc 312, stable. No infusion issues or bleeding noted.  Goal of Therapy:  Heparin goal 0.3-0.5 units/ml Monitor platelets by anticoagulation protocol: Yes   Plan:  Continue heparin infusion at 1600 units/hr Confirmatory heparin level at 1200  Monitor daily heparin levels, CBCs, and s/sx of bleeding  F/u on transition to oral anticoagulation   Thank you for involving pharmacy in this patient's care.  Janae Bridgeman, PharmD PGY1 Pharmacy Resident Phone: [phone] 01/03/2019 8:30 AM

## 2019-01-03 NOTE — Consult Note (Signed)
Consultation Note Date: 01/03/2019   Patient Name: Micheal Holt  DOB: 13-Dec-1940  MRN: [id number]  Age / Sex: 78 y.o., male  PCP: Micheal Downing, MD Referring Physician: McDiarmid, Micheal Ohara, MD  Reason for Consultation: Establishing goals of care  HPI/Patient Profile: 78 y.o. male   admitted on 12/30/2018   [name] is a 78 y.o. male with history of chronic diastolic CHF, atrial fibrillation, prior TIA, OSA, HTN, DM, CKD and HLDpresenting with a suspected fall (found down prone at home), elevated BP and left sided weakness. He did not receive IV t-PA due to late presentation.  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  Patient's MRI with acute/sub acute R fontal and insular infarct. He also has moderate atrophy. The patient has been followed by neurology, stroke pathway was being implemented.   It was noted that the patient's son and daughter expressed the patient's previously expressed wishes and advanced directives that he would not want feeding tubes or nursing home level of care.   Hence, a palliative consult has been requested.   The patient is awake, looks mostly towards his R side. He is not able to swallow his secretions, mouth is open, has facial droop, has L sided weakness. He is not able to communicate, has severe dysarthria. He keeps on asking for some water and some ice cream.   I introduced myself and palliative care as follows: Palliative medicine is specialized medical care for people living with serious illness. It focuses on providing relief from the symptoms and stress of a serious illness. The goal is to improve quality of life for both the patient and the family.  I met with the patient, his son and his daughter at the bedside. 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   We discussed about the MRI results, about the current hospitalization. We were also visited by the patient's SLP specialist, who stated that the patient was NPO and deemed not safe for any kind of PO diet. We discussed about temporary feeding tubes, NGT or Dobbhoff tubes, tube feeds, continuation of stroke pathway, efforts at PT OT etc.   Family expresses very strongly about opting for comfort measures, the patient periodically states that he wants water and ice cream. He is able to answer very simple yes/no questions, he has severe dysarthria, he often closed his eyes and isn't able to engage.  Please note additional discussions below.   NEXT OF KIN  wife has dementia, she lives in a facility in [place], near their daughter.  Patient is a retired Engineer, maintenance (IT) Patient has a son and a daughter, son lives in [place] and daughter lives in [place]. They are both currently in town, at the patient's bedside.   SUMMARY OF RECOMMENDATIONS    DNR DNI re discussed and re confirmed  Goals of care: patient, son and daughter state that the patient has made advanced directives a while back, in which he clearly outlined no artificial mode of nutrition, no heroic measures at end of life.   Patient's family describes him as a fiercely independent person, he wished to continue living by himself, was hesitant and disapproving of aides that would come to check on him. They do not envision that feeding tubes and nursing homes is what the patient would want.  We discussed about comfort feeds, comfort care and residential hospice. Patient and family wish to proceed.  Discussed with primary service CSW consult for residential hospice SLP to re evaluate for safest possible comfort feeds, patient and family recognize risk for aspiration.  Thank you for the consult.   Code Status/Advance Care Planning:  DNR    Symptom  Management:    as above   Palliative Prophylaxis:   Delirium Protocol  Additional Recommendations (Limitations, Scope, Preferences):  Full Comfort Care  Psycho-social/Spiritual:   Desire for further Chaplaincy support:yes  Additional Recommendations: Education on Hospice  Prognosis:   < 2 weeks  Discharge Planning: Hospice facility      Primary Diagnoses: Present on Admission: . CVA (cerebral vascular accident) ([place]) . Pulmonary edema cardiac cause ([place]) . OSA (obstructive sleep apnea) . Morbid obesity ([place]) . Left hemiplegia ([place]) . Hypoxemia . Medical misadventure . Essential hypertension . Diabetes type 2, uncontrolled ([place]) . CKD (chronic kidney disease), symptom management only, stage 4 (severe) ([place]) . Dysphagia, pharyngoesophageal phase   I have reviewed the medical record, interviewed the patient and family, and examined the patient. The following aspects are pertinent.  Past Medical History:  Diagnosis Date  . Atrial fibrillation ([place])   . Cerebrovascular disease    by MRA in Feb 2004 with moderate posterior cerebral artery stenosis  . Chest pain    most likely Gastroesophageal reflux  . Chronic diastolic CHF (congestive heart failure), NYHA class 1 ([place])    currently euvolemic  . Chronic kidney disease (CKD), stage II (mild)   . Colon polyps   . DM (diabetes mellitus) ([place])    type 2  . Dyspnea   . Hx of adenomatous colonic polyps 10/14/2016  . Hyperlipidemia 2009  . Hypertension   . Obesity    moderate to severe  . Obstructive sleep apnea    he refused to use cpap  . Pelvic floor dysfunction 11/17/2016  . Skin cancer   . TIA (transient ischemic attack)    in Feb of 2004  . Vertigo    Social History   Socioeconomic History  . Marital status: Married    Spouse name: Not on file  . Number of children: 2  . Years of education: masters   . Highest education level: Not on file  Occupational History  . Occupation: retired  Scientific laboratory technician   . Financial resource strain: Not on file  . Food insecurity:    Worry: Not on file    Inability: Not on file  . Transportation needs:    Medical: Not on file    Non-medical: Not on file  Tobacco Use  . Smoking status: Never Smoker  . Smokeless tobacco: Never Used  Substance and Sexual Activity  . Alcohol use: No  . Drug use: No  . Sexual activity: Not on file  Lifestyle  . Physical activity:    Days per week: Not on file    Minutes per session: Not on file  . Stress: Not on  file  Relationships  . Social connections:    Talks on phone: Not on file    Gets together: Not on file    Attends religious service: Not on file    Active member of club or organization: Not on file    Attends meetings of clubs or organizations: Not on file    Relationship status: Not on file  Other Topics Concern  . 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 Stroke Mother   . Heart failure Father   . Diabetes type II Father   . Colon cancer Father        ? if cancer, had colon resection  . Colon polyps Father   . Ovarian cancer Sister        mets  . Arthritis Maternal Grandmother   . Heart failure Maternal Grandfather   . Diabetes type I Paternal Grandmother   . Dementia Paternal Grandfather   . Heart failure Paternal Grandfather    Scheduled Meds: . aspirin  300 mg Rectal Daily   Or  . aspirin  325 mg Oral Daily  . atorvastatin  40 mg Oral q1800  . chlorhexidine  15 mL Mouth Rinse BID  . furosemide  20 mg Intravenous Daily  . haloperidol lactate  1 mg Intramuscular Once  . hydrALAZINE  20 mg Intravenous TID  . insulin aspart  0-9 Units Subcutaneous TID WC  . mouth rinse  15 mL Mouth Rinse q12n4p  . metoprolol tartrate  15 mg Intravenous Q6H  . nitroGLYCERIN  0.2 mg Transdermal Daily   Continuous Infusions: .   ceFAZolin (ANCEF) IV 1 g (01/03/19 1447)  . heparin 1,550 Units/hr (01/03/19 1412)   PRN Meds:.acetaminophen **OR** acetaminophen (TYLENOL) oral liquid 160 mg/5 mL **OR** acetaminophen, albuterol Medications Prior to Admission:  Prior to Admission medications   Medication Sig Start Date End Date Taking? Authorizing Provider  apixaban (ELIQUIS) 5 MG TABS tablet Take 1 tablet (5 mg total) by mouth 2 (two) times daily. 09/04/18  Yes Croitoru, Mihai, MD  Cholecalciferol (VITAMIN D-3) 25 MCG (1000 UT) CAPS Take 1,000 Units by mouth daily.   Yes [provider]  DULoxetine (CYMBALTA) 60 MG capsule Take 60 mg by mouth daily.   Yes [provider]  Ertugliflozin-SITagliptin (STEGLUJAN) 5-100 MG TABS Take 1 tablet by mouth daily.   Yes [provider]  furosemide (LASIX) 40 MG tablet Take 1 tablet (40 mg total) by mouth daily. 09/11/18  Yes Barrett, Evelene Croon, PA-C  glipiZIDE (GLUCOTROL) 5 MG tablet Take 5 mg by mouth daily as needed (diabetes mellitus *MAX of 4 tablets per 24hrs).    Yes [provider]  JARDIANCE 10 MG TABS tablet Take 10 mg by mouth daily.  08/04/18  Yes [provider]  metoprolol succinate (TOPROL-XL) 50 MG 24 hr tablet TAKE 1 TABLET BY MOUTH DAILY Patient taking differently: Take 50 mg by mouth daily.  12/18/18  Yes Barrett, Evelene Croon, PA-C  potassium chloride SA (K-DUR,KLOR-CON) 20 MEQ tablet TAKE 1 TABLET BY MOUTH DAILY Patient taking differently: Take 20 mEq by mouth daily.  12/18/18  Yes Barrett, Evelene Croon, PA-C  Rivaroxaban (XARELTO) 15 MG TABS tablet Take 1 tablet (15 mg total) by mouth daily with supper. 12/19/18  Yes Barrett, Evelene Croon,  PA-C  Semaglutide, 1 MG/DOSE, (OZEMPIC, 1 MG/DOSE,) 2 MG/1.5ML SOPN Inject 1 mg into the skin once a week.   Yes [provider]  tamsulosin (FLOMAX) 0.4 MG CAPS capsule Take 1 capsule (0.4 mg total) by mouth at bedtime. 07/08/17  Yes Meuth, Brooke A, PA-C  metoprolol succinate (TOPROL-XL) 25 MG 24  hr tablet Take 25 mg by mouth daily.    [provider]   No Known Allergies Review of Systems Mostly non verbal Asks for water and ice cream  Physical Exam Severely dysarthric Weakness R gaze preference Has secretions he is not able to swallow Severe L sided weakness Eyes open  Vital Signs: BP (!) 153/100 (BP Location: Left Arm)   Pulse 83   Temp 97.8 F (36.6 C) (Axillary)   Resp 20   Ht _0  (1.778 m)   Wt 91.5 kg   SpO2 96%   BMI 28.94 kg/m  Pain Scale: 0-10   Pain Score: 0-No pain   SpO2: SpO2: 96 % O2 Device:SpO2: 96 % O2 Flow Rate: .O2 Flow Rate (L/min): 2 L/min  IO: Intake/output summary:   Intake/Output Summary (Last 24 hours) at 01/03/2019 1534 Last data filed at 01/03/2019 1447 Gross per 24 hour  Intake 400.19 ml  Output 650 ml  Net -249.81 ml    LBM: Last BM Date: 01/02/19 Baseline Weight: Weight: 94.8 kg Most recent weight: Weight: 91.5 kg     Palliative Assessment/Data:   Flowsheet Rows     Most Recent Value  Intake Tab  Referral Department  Hospitalist  Unit at Time of Referral  Other (Comment) [neurology]  Palliative Care Primary Diagnosis  Neurology  Palliative Care Type  New Palliative care  Reason for referral  Clarify Goals of Care  Date first seen by Palliative Care  01/03/19  Clinical Assessment  Palliative Performance Scale Score  20%  Pain Max last 24 hours  4  Pain Min Last 24 hours  3  Dyspnea Max Last 24 Hours  4  Dyspnea Min Last 24 hours  3  Psychosocial & Spiritual Assessment  Palliative Care Outcomes  Patient/Family meeting held?  Yes  Who was at the meeting?  son and daughter at bedside.      PPS 20% Time In:  1420 Time Out:  1530 Time Total:  70 min  Greater than 50%  of this time was spent counseling and coordinating care related to the above assessment and plan.  Signed by: Loistine Chance, MD [phone]  Please contact Palliative Medicine Team phone at [phone] for questions and concerns.  For  individual provider: See Shea Evans

## 2019-01-03 NOTE — Progress Notes (Signed)
STROKE TEAM PROGRESS NOTE   SUBJECTIVE (INTERVAL HISTORY) The patient remains severely dysarthric with dense left hemiplegia and currently speech therapist is at bedside.  Patient is more alert interactive and speaking better today.  He states that he wants a temporary feeding tube if he cannot swallow safely OBJECTIVE Vitals:   01/03/19 0013 01/03/19 0446 01/03/19 0841 01/03/19 1122  BP: (!) 141/100 (!) 155/87 (!) 172/109 (!) 153/100  Pulse: 92 (!) 106 89 83  Resp: 17 19 (!) 22 20  Temp: 98.5 F (36.9 C) 98.2 F (36.8 C) 98.3 F (36.8 C) 97.8 F (36.6 C)  TempSrc: Oral Oral Axillary Axillary  SpO2: 94% 93% 93% 96%  Weight:      Height:        CBC:  Recent Labs  Lab 01/01/19 0609 01/02/19 0347 01/03/19 0432  WBC 11.9* 10.3 9.4  NEUTROABS 9.8* 8.1*  --   HGB 15.5 16.9 16.3  HCT 46.5 51.8 48.6  MCV 85.0 84.9 85.1  PLT 288 301 381    Basic Metabolic Panel:  Recent Labs  Lab 01/02/19 0347 01/03/19 0432  NA 144 146*  K 3.0* 2.7*  CL 103 105  CO2 23 24  GLUCOSE 106* 135*  BUN 28* 39*  CREATININE 1.89* 1.94*  CALCIUM 8.7* 8.8*    Lipid Panel:     Component Value Date/Time   CHOL 152 12/31/2018 0422   CHOL 176 09/11/2018 1452   TRIG 55 12/31/2018 0422   HDL 48 12/31/2018 0422   HDL 38 (L) 09/11/2018 1452   CHOLHDL 3.2 12/31/2018 0422   VLDL 11 12/31/2018 0422   LDLCALC 93 12/31/2018 0422   LDLCALC 94 09/11/2018 1452   HgbA1c:  Lab Results  Component Value Date   HGBA1C 7.1 (H) 12/31/2018   Urine Drug Screen:     Component Value Date/Time   LABOPIA NONE DETECTED 03/16/2016 1650   COCAINSCRNUR NONE DETECTED 03/16/2016 1650   LABBENZ NONE DETECTED 03/16/2016 1650   AMPHETMU NONE DETECTED 03/16/2016 1650   THCU NONE DETECTED 03/16/2016 1650   LABBARB NONE DETECTED 03/16/2016 1650    Alcohol Level     Component Value Date/Time   ETH <10 07/05/2017 1149    IMAGING Ct Head Wo Contrast Ct Cervical Spine Wo Contrast Ct Maxillofacial Wo  Contrast 12/30/2018 1. Right parietal hypodensity may represent acute infarct. MRI would be necessary to confirm.  2. Atrophy and ventricular enlargement is stable. Chronic ischemic changes are present bilaterally. No intracranial hemorrhage  3. Negative for facial fracture  4. Negative for cervical spine fracture.   MRI / MRA Brain  01/02/2019 1. Acute/subacute large nonhemorrhagic infarct involving the right frontal operculum and insular cortex. 2. No flow evident in the M1 segments at the bifurcations on either side. This is in part artifactual. Distal right M1 segment stenosis or occlusion is suspected. 3. Diffuse atherosclerotic changes in the cavernous internal carotid arteries and at the basilar artery. 4. Moderate diffuse atrophy and white matter disease reflects the sequela of chronic microvascular ischemia.    Dg Chest Port 1 View 12/30/2018 Interstitial and alveolar airspace opacities concerning for edema/CHF.   Transthoracic Echocardiogram  Right Carotid: There is no evidence of stenosis in the right ICA. Left Carotid: There is no evidence of stenosis in the left ICA. Vertebrals:  Bilateral vertebral arteries demonstrate antegrade flow. Subclavians: Normal flow hemodynamics were seen in bilateral subclavian arteries.  Bilateral Carotid Dopplers  12/31/2018  1. The left ventricle has severely reduced systolic function, with  an ejection fraction of 25-30%. The cavity size was normal. Left ventricular diastolic Doppler parameters are consistent with indeterminate diastolic dysfunction. Left ventricular  diffuse hypokinesis.  2. Left atrial size was moderately dilated.  3. Aortic valve regurgitation is trivial by color flow Doppler.  4. Mitral valve regurgitation is mild to moderate by color flow Doppler.  5. No intracardiac thrombi or masses were visualized. Definity was used for LV endocardial border enhancement, no evidence of apical or mural thrombus noted.  EKG - atrial  fibrillation - ventricular response 112 BPM (See cardiology reading for complete details)  EEG 1.  Focal central parietal slowing on the right side which can be seen from focal cerebral disturbance and/or epileptic focus. 2.  Infrequent generalized rhythmic delta activity with frontocentral maximum typically seen in toxic metabolic processes.   PHYSICAL EXAM: Elderly Caucasian male in mild respiratory distress. . Afebrile. Head is nontraumatic. Neck is supple without bruit.    Cardiac exam no murmur or gallop. Lungs are clear to auscultation. Distal pulses are well felt. Blood pressure (!) 153/100, pulse 83, temperature 97.8 F (36.6 C), temperature source Axillary, resp. rate 20, height 5\' 10"  (1.778 m), weight 91.5 kg, SpO2 96 %. Gen: NAD, [place]/AT, on bipap                 Neuro: Awake and alert today.  Severely dysarthric can barely be understood.  Will follow simple commands.  Right gaze preference unable to look to the left past midline.  Blinks to threat on the right but not on the left.  Able to look up and down.  Severe left lower facial weakness.  Tongue midline.  Weak cough and gag.  Dense left hemiplegia but will withdraw partially to painful stimulus.  Purposeful antigravity strength on the right side.    ASSESSMENT/PLAN [name] is a 78 y.o. male with history of chronic diastolic CHF, atrial fibrillation, prior TIA, OSA, HTN, DM, CKD and HLD presenting with a suspected fall (found down prone at home), elevated BP and left sided weakness. He did not receive IV t-PA due to late presentation.  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  MRI / MRA head - acute/subacute R frontal and insular infarct. Distal R M1 occlusion suspected. Diffuse atherosclerosis B ICA and BA. Mod atrophy and SVD.  Carotid Doppler - B ICA 1-39% stenosis, VAs antegrade   2D Echo  - EF 25-30%. LA dilated. No source of embolus   Corona virus - negative  EEG no sz, findings w/o stroke  LDL - 93  HgbA1c - 7.1  UDS - not performed  VTE prophylaxis - IV heparin  Eliquis and/or Xarelto prior to admission, now on aspirin 300 mg suppository daily and heparin IV. Plan [place] once can swallow  Therapy recommendations:  SNF  Disposition:  Pending  Atrial Fibrillation w/o RVR  Home anticoagulation:  Eliquis/Xarelto   Now on IV heparin  Plan OAC once can swallow   Hypertension  Blood pressure elevated but within post stroke parameters  Concern on full dose lovenox and bleeding risk. Discussed w/ Dr. Leonie Man who will d/c . Permissive hypertension (OK if < 220/120) but gradually normalize in 5-7 days . Long-term BP goal normotensive  Hyperlipidemia  Lipid lowering medication PTA: none  LDL93, goal < 70  Current lipid lowering medication: Lipitor 40 mg daily (on  hold as NPO)  Continue statin at discharge  Diabetes  HgbA1c 7.1, goal < 7.0  Uncontrolled  Dysphagia  Failed swallow  NPO  ? TF, ? PEG. To discuss with family  Other Stroke Risk Factors  Advanced age  Over weight, Body mass index is 28.94 kg/m., recommend weight loss, diet and exercise as appropriate   TIA hx 2004  Family hx stroke (mother)  Obstructive sleep apnea, noncompliant w/ CPAP  Acute on chronic CHF e/ EF 25-30%  Other Active Problems  Medical non compliance   UTI, acute. Leukocytosis - 15.7->12.5->10.3 (afebrile)  Mild thrombocytopenia, resolved  CKD stage III  Lactic acidosis  BPH  Hospital day # 4    He presented with right MCA infarct secondary to atrial fibrillation and not being on anticoagulation.  Patient has significant dysarthria dysphasia and left hemiplegia and likely major disability and will unable to live without assistance and requiring prolonged nutritional support and rehab.  Patient apparently has a advanced directive but  to me today he  states he does   want feeding tube in my conversation yesterday with  patient's daughter family were  leaning towards comfort care measures.  I recommend patient's  make a final decision soon..  Discussed with Dr. Ouida Sills MD.  Greater than 50% time during this 25-minute visit was spent on counseling and coordination of care about his embolic stroke and discussion about evaluation and treatment plan Discussed with patient, speech therapist and care team.  Stroke team will sign off.  Kindly call for questions. Antony Contras, MD Medical Director [place] Pager: [phone] 01/03/2019 2:23 PM   To contact Stroke Continuity provider, please refer to [email or web address] After hours, contact General Neurology

## 2019-01-03 NOTE — Progress Notes (Signed)
Confirmed timing of comfort care transition with palliative, we will go with a modified order set (no labs, no physical interventions that would aggitate patient, comfort feeds allowed, no PO meds or further imaging, IV meds will continue) until patient is connected w/ hospice.  He will transition to FULL comfort care with the hospice team on d/c unless family expresses a desire to start earlier, patient is DNR.  -Dr. Criss Rosales

## 2019-01-03 NOTE — Care Management Important Message (Signed)
Important Message  Patient Details  Name: Micheal Holt MRN: [id number] Date of Birth: February 20, 1941   Medicare Important Message Given:  Yes  Due to the illness patient was not able to sign.  Unsigned copy left.  Madex Seals 01/03/2019, 1:23 PM

## 2019-01-03 NOTE — Progress Notes (Signed)
  Speech Language Pathology Treatment: Dysphagia  Patient Details Name: Micheal Holt MRN: [id number] DOB: 1941-09-02 Today's Date: 01/03/2019 Time: 1410-3013 SLP Time Calculation (min) (ACUTE ONLY): 16 min  Assessment / Plan / Recommendation Clinical Impression  Improved alertness for ice chip, water and applesauce trials. Significant oral impairments affecting manipulation and transit leading to right spill, holding and pocketing of ice chip. With delays he successfully transited amounts of water and puree to initiate swallow 80% of the time with delayed intermittent coughs. He is appropriate for MBS which is scheduled today at 1330.   HPI HPI: 78 year old male admitted 12/30/2018 with left weakness, fall and CVA. PMH: HFrEF, HTN, DM2, OSA, HLD, CKD III, TIA. Head CT = right parietal hypodensity. MRI pending. CXR = Interstitial and alveolar airspace opacities concerning for edema/CHF.       SLP Plan  MBS       Recommendations  Diet recommendations: NPO                Oral Care Recommendations: Oral care QID Follow up Recommendations: Skilled Nursing facility;24 hour supervision/assistance SLP Visit Diagnosis: Dysphagia, unspecified (R13.10) Plan: MBS       GO                Micheal Holt 01/03/2019, 11:09 AM   Orbie Pyo Colvin Caroli.Ed Risk analyst [phone] Office [phone]

## 2019-01-03 NOTE — Progress Notes (Signed)
Modified Barium Swallow Progress Note  Patient Details  Name: Micheal Holt MRN: [id number] Date of Birth: [birth year]  Today's Date: 01/03/2019  Modified Barium Swallow completed.  Full report located under Chart Review in the Imaging Section.  Brief recommendations include the following:  Clinical Impression  Moderate-severe oral dysphagia exacerbated by cognitive effects from stroke. He was unable to form a bolus with nectar or puree consistency and propel posteriorally. Significant loss of bolus on left side with nectar with barium falling under tongue and unable to gather and contain liquid. Applesauce was held anteriorally despite repetitive tactile/visual assist. Although 1-2 swallows observed at bedside this morning, pharyngeal swallow not initiated during this study. Oral cavity cleaned. Discussion needed with family for Palliative measures re: nutrition. Prognosis unknown but dysphagia may exceed benefits of short term Cortrak and ability to maintain nutrition questionable even if he begins to to initiate po swallows. Will follow for trial therapy/intervention.         Swallow Evaluation Recommendations       SLP Diet Recommendations: NPO       Medication Administration: Via alternative means               Oral Care Recommendations: Oral care QID        [place] Siren 01/03/2019,3:01 PM   Orbie Pyo [place].Ed Risk analyst [phone] Office [phone]

## 2019-01-03 NOTE — TOC Progression Note (Signed)
Transition of Care [facility]) - Progression Note    Patient Details  Name: Micheal Holt MRN: [id number] Date of Birth: 1940-11-15  Transition of Care [facility]) CM/SW [place],  Phone Number: 01/03/2019, 4:15 PM  Clinical Narrative:   CSW acknowledging consult for residential hospice. CSW sent referral to [facility]. CSW spoke with daughter, Tomi Bamberger, to alert her that someone with [facility] would be reaching out to her at some point. CSW to follow.    Expected Discharge Plan: [place] Barriers to Discharge: Continued Medical Work up  Expected Discharge Plan and Services Expected Discharge Plan: [place] In-house Referral: Clinical Social Work     Living arrangements for the past 2 months: Single Family Home                                       Social Determinants of Health (SDOH) Interventions    Readmission Risk Interventions No flowsheet data found.

## 2019-01-03 NOTE — Progress Notes (Signed)
Physical Therapy Treatment Patient Details Name: Micheal Holt MRN: [id number] DOB: Oct 06, 1940 Today's Date: 01/03/2019    History of Present Illness Micheal Holt is a 78 y.o. male presenting with weakness, fall and new CVA. PMH is significant for HFrEF, HTN, T2DM, OSA (noncompliant with CPAP), HLD,  CKD III, and prior TIA. CT: Right parietal hypodensity may represent acute infarct; MRI pending    PT Comments    Pt progressing this session, able to be roused enough to participate with mobility. 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 Pt tolerated EOB activity well and participated in seated reaching activity with mod-max assist for trunk control. Pt with more difficulty weight bearing through R forearm and pushing back up into sitting - wanting to lean backwards instead. Will continue to follow and progress as able per POC.    Follow Up Recommendations  SNF;Supervision/Assistance - 24 hour     Equipment Recommendations  Other (comment)(TBD by next venue of care)    Recommendations for Other Services       Precautions / Restrictions Precautions Precautions: Fall Restrictions Weight Bearing Restrictions: No    Mobility  Bed Mobility Overal bed mobility: Needs Assistance Bed Mobility: Rolling;Sidelying to Sit;Sit to Sidelying Rolling: Max assist Sidelying to sit: Max assist;+2 for physical assistance;HOB elevated     Sit to sidelying: Total assist;+2 for physical assistance General bed mobility comments: Pt following some commands to reach for therapist and was attempting to assist with rolling activity. Pt pulling trunk up with RUE on therapist's shoulder for support during transition to full sitting position.   Transfers                 General transfer comment: Not able to progress to OOB mobility at this time.   Ambulation/Gait                 Stairs              Wheelchair Mobility    Modified Rankin (Stroke Patients Only) Modified Rankin (Stroke Patients Only) Pre-Morbid Rankin Score: No symptoms Modified Rankin: Severe disability     Balance Overall balance assessment: Needs assistance Sitting-balance support: Bilateral upper extremity supported;Feet supported Sitting balance-Leahy Scale: Poor Sitting balance - Comments: Short bouts of improved sitting balance (mod A) but mostly max assist. Postural control: Posterior lean;Left lateral lean                                  Cognition Arousal/Alertness: Lethargic Behavior During Therapy: Flat affect Overall Cognitive Status: No family/caregiver present to determine baseline cognitive functioning                                 General Comments: Followed simple 1 step commands intermittently. 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      Exercises General Exercises - Upper Extremity Shoulder Flexion: 5 reps;AROM;Right;PROM;Left General Exercises - Lower Extremity Heel Slides: 5 reps;AROM;Right;PROM;Left    General Comments        Pertinent Vitals/Pain Pain Assessment: Faces Faces Pain Scale: Hurts a little bit Pain Location: intermittent moaning, grimacing Pain Descriptors / Indicators: Grimacing;Moaning Pain Intervention(s): Monitored during session    Home Living  Prior Function            PT Goals (current goals can now be found in the care plan section) Acute Rehab PT Goals Patient Stated Goal: Drink water PT Goal Formulation: With patient Time For Goal Achievement: 01/15/19 Potential to Achieve Goals: Good Progress towards PT goals: Progressing toward goals    Frequency    Min 3X/week      PT Plan Current plan remains appropriate    Co-evaluation              AM-PAC PT "6 Clicks" Mobility   Outcome Measure  Help needed turning from your  back to your side while in a flat bed without using bedrails?: Total Help needed moving from lying on your back to sitting on the side of a flat bed without using bedrails?: Total Help needed moving to and from a bed to a chair (including a wheelchair)?: Total Help needed standing up from a chair using your arms (e.g., wheelchair or bedside chair)?: Total Help needed to walk in hospital room?: Total Help needed climbing 3-5 steps with a railing? : Total 6 Click Score: 6    End of Session Equipment Utilized During Treatment: Oxygen Activity Tolerance: Patient limited by lethargy Patient left: in bed;with call bell/phone within reach Nurse Communication: Mobility status PT Visit Diagnosis: Other symptoms and signs involving the nervous system (R29.898);Hemiplegia and hemiparesis Hemiplegia - Right/Left: Left Hemiplegia - dominant/non-dominant: Non-dominant Hemiplegia - caused by: Cerebral infarction     Time: 7356-7014 PT Time Calculation (min) (ACUTE ONLY): 21 min  Charges:  $Therapeutic Activity: 8-22 mins                     [name], PT, DPT Acute Rehabilitation Services Pager: [phone] Office: [phone]    [name] 01/03/2019, 11:32 AM

## 2019-01-03 NOTE — Progress Notes (Signed)
Family Medicine Teaching Service Daily Progress Note Intern Pager: [phone]  Patient name: Micheal Holt Medical record number: [id number] Date of birth: 1940/11/11 Age: 78 y.o. Gender: male  Primary Care Provider: Leonard Downing, MD Consultants: Neuro Code Status: DNR/DNI  Pt Overview and Major Events to Date:  4/26 admitted   Assessment and Plan: Micheal Holt Staffordis a 78 y.o.malepresenting with weakness, fall and new CVA. PMH is significant forHFrEF,HTN,T2DM,OSA (noncompliant with CPAP), HLD, CKD III, and prior TIA  CVA,right MCA ischemic infarct: Acute/subacute, stable. Likely embolic stroke secondary to A. fib..  No significant carotid stenosis on Dopplers. PT/OT rec SNF.  Family/POA against feeding tube at this time.  Family trying to visit patient at bedtime in the next several days and may alter their decision at that time. - Neurology following, appreciate further recommendations - Aspirin 300 mg suppository -Start atorvastatin when can p.o. --Call daughter about updates  Acute on chronic HFrEF with EF 25-30%:Improving.  On 2L nasal cannula, often times out of his nose.  1.2 L urine output on 4/28.  Physical exam shows that he is euvolemic. - IV Lasix 20 mg daily, home dose - Strict I and O's, daily weights - Oxygen therapy as needed - Attempt to avoid BiPAP   UTI: Acute.  Endorses dysuria with recent urinary incontinence on admit with > 100,000 colonies of E. coli.  Susceptibilities pending. -Continue Ancef IV (4/27-)  - Follow-up susceptibilities  Micheal fibrillationwithout Holt:YDXAJOI, stable.  HR 90-100.  Mali vasc 7.  Has been taking both Eliquis and Xarelto instead of discontinuing Xarelto as instructed, and metoprolol for rate control. - Continue heparin IV per stroke protocol (likely to do Eliquis given concern for appropriate fatty meal with Xarelto in the future)  - Cont IV Lopressor 15 mg every 6  Hypertension: Chronic, poorly controlled.    Elevated blood pressures in the past 24 hours with systolics as high as 786 and diastolics as high as 767.  Outside the window for permissive hypertension. -IV metoprolol 15 mg every 6 hours -Increase to hydralazine IV 20 mg TID, - Nitro-Dur 0.2 mg  - Likely restart lisinopril low dose when po   Hypokalemia Potassium two-point 3 in the morning of 4/29. -Replete with IV runs x6 -Continue to monitor  Abnormal EEG:  Neurology notes not consistent with seizure. - Neurology following, appreciate further recommendations  CKD, stageIII:Chronic, stable.  Creatinine 1.89, baseline around 1.9.  - Monitor I and O's - Monitor BMP - Avoid nephrotoxic medications as possible  T2DM:Chronic, stable.   A1c 7.1.  Takes glipizide 5 mg, Jardiance 10 mg, and Ozempic 1 mg weekly at home.  Blood glucose 100 158 in the past 24 hours.  1 unit aspart given in the past 24 hours. - SSI - CBGs with meals and at bedtime - Consider DC of glipizide at discharge  H/o TIA (2004)  Stenosis of posterior cerebral artery: Chronic, stable. Previously on ASA and Clopidogrel, stopped when patient started Eliquis.  - Start atorvastatin when po   OSA: Chronic, stable.   Noncompliant with CPAP.  -Attempt CPAP nightly  MCN:OBSJGGE, stable. Lipid panel WNL.  Patient is currently not on statin secondary to noncompliance. - Start atorvastatin when po   BPH: Chronic, stable. Patient on flomax at home daily.  - Continue Flomax  FEN/GI:N.p.o. at present, pending conversation with children/HCPOA Prophylaxis:heparin IV   Disposition:Continue work-up  Subjective:  No acute events overnight.  CPAP was removed earlier in the evening patient made on nasal cannula  overnight.  Continued Cheyne-Stokes breathing per nurse.  Objective: Temp:  [98.2 F (36.8 C)-98.7 F (37.1 C)] 98.2 F (36.8 C) (04/29 0446) Pulse Rate:  [73-106] 106 (04/29 0446) Resp:  [17-25] 19 (04/29 0446) BP: (141-214)/(32-128)  155/87 (04/29 0446) SpO2:  [90 %-99 %] 93 % (04/29 0446)  General: Alert and cooperative and appears to be in no acute distress HEENT: Blood pooled in left ear.  Otoscopic exam showed significant cerumen without any evident hemorrhage from the middle ear, TM not visualized.  Skin tear was noted anterior to the tragus.  No appreciable JVD Cardio: Normal S1 and S2, no S3 or S4. Rhythm is regular.  2/6 systolic murmur loudest left sternal border.   Pulm: Clear to auscultation bilaterally, no crackles, wheezing, or diminished breath sounds.  Cheyne-Stokes breathing noted throughout the exam on 2 L nasal cannula. Abdomen: Bowel sounds normal. Abdomen soft and non-tender.  Extremities: No peripheral edema. Warm/ well perfused.  Strong radial and pedal pulses. Neuro: Cranial nerves grossly intact   Laboratory: Recent Labs  Lab 01/01/19 0609 01/02/19 0347 01/03/19 0432  WBC 11.9* 10.3 9.4  HGB 15.5 16.9 16.3  HCT 46.5 51.8 48.6  PLT 288 301 312   Recent Labs  Lab 12/30/18 2003 12/31/18 0422 01/01/19 0609 01/02/19 0347  NA 138 140 143 144  K 3.8 4.3 3.2* 3.0*  CL 105 107 108 103  CO2 17* 16* 23 23  BUN 19 21 23  28*  CREATININE 1.90* 2.03* 1.96* 1.89*  CALCIUM 8.9 8.8* 8.5* 8.7*  PROT 6.4*  --   --   --   BILITOT 1.4*  --   --   --   ALKPHOS 89  --   --   --   ALT 12  --   --   --   AST 21  --   --   --   GLUCOSE 242* 244* 134* 106*    Imaging/Diagnostic Tests: No results found.   Matilde Haymaker, MD 01/03/2019, 5:41 AM PGY-1, [place] Intern pager: [phone], text pages welcome

## 2019-01-04 DIAGNOSIS — I63019 Cerebral infarction due to thrombosis of unspecified vertebral artery: Secondary | ICD-10-CM

## 2019-01-04 LAB — CULTURE, BLOOD (ROUTINE X 2)
Culture: NO GROWTH
Culture: NO GROWTH

## 2019-01-04 LAB — GLUCOSE, CAPILLARY
Glucose-Capillary: 102 mg/dL — ABNORMAL HIGH (ref 70–99)
Glucose-Capillary: 122 mg/dL — ABNORMAL HIGH (ref 70–99)
Glucose-Capillary: 133 mg/dL — ABNORMAL HIGH (ref 70–99)

## 2019-01-04 NOTE — Progress Notes (Signed)
Transfer to Hospice Outpatient via stretcher per PTAR.  Report given to nurse via phone.  Family at bedside.

## 2019-01-04 NOTE — Progress Notes (Signed)
PMT progress note  Patient is less alert this afternoon, appears to have some apneic breathing, patient's son and daughter are at the bedside, they state that they are awaiting residential hospice transfer later today.   The patient's son and daughter state that they plan on getting the patient on a video call to see his wife, who is in a facility in [place], once he is transferred to hospice.   Earlier today, the patient reportedly has been making additional wishes with his family, continues to have slurred speech, but he has told them about what type of a funeral he would want and also telling his family about unfinished business/ unresolved affairs.   BP (!) 179/100 (BP Location: Left Arm)   Pulse 86   Temp 97.7 F (36.5 C) (Oral)   Resp 20   Ht 5\' 10"  (1.778 m)   Wt 91.5 kg   SpO2 100%   BMI 28.94 kg/m  Labs and imaging noted Medication history noted  Facial droop Secretions L sided weakness Doesn't open eyes Apneic at times Regular S1 S2  No edema Thin weak appearing gentleman  PPS 20%  Life limiting illness: CVA large frontal and insular infarct, acute on chronic HFrEF, A fib, CKD.  PLAN: transfer to residential hospice, comfort care. Continue current mode of care.  No additional PMT specific recommendations at this time.  Offered active listening and supportive care, encouraged self care for son and daughter, discussed with them about end of life signs and symptoms and expected decline trajectory and the type of care that is provided in a residential hospice setting.   25 minutes spent.  Loistine Chance MD [facility] health palliative medicine team [phone].
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
## 2019-01-04 NOTE — Progress Notes (Signed)
[facility])  Received request from [place] for family interest in [place]. Chart being reviewed this morning, eligibility pending. Will follow up with CSW and family a little later this morning.   Thank you,  Heloise Purpura [phone]  [facility] hospital liaisons are listed daily on AMION under Hospice and [place]

## 2019-01-04 NOTE — Discharge Summary (Addendum)
[facility] Discharge Summary  Patient name: Micheal Holt Medical record number: [id number] Date of birth: July 20, 1941 Age: 78 y.o. 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 Lungs: Clear bilaterally, no increased WOB, with Cheyne-Stokes breathing continued on 2 LNC. Laying flat.  Abdomen: soft, non-tender, non-distended, hypo-active BS Ext: Warm, dry, 2+ distal pulses, no edema  Neuro: Alert, able to say hello, smile, and give a thumbs up.  Can say short words that are mostly understandable.  Still with left hemiplegia and left facial droop.  Brief Hospital Course:  [name] is a 78 year old gentleman with a history of TIA, T2DM, HTN, and OSA non-compliant with CPAP that presented with acute onset left sided hemiplegia, left facial droop, and dysarthria concerning for CVA.   Acute/subacute R frontal and insular infarct/CVA: Now comfort care Neurology consulted on arrival, TPA not given as he was out of appropiate time frame. MRI/MRA head showing large acute/subacute non-hemorrhage infarct. Echo without evidence of thrombi. Carotid U/S unremarkable. Felt to secondary embolic 2/2 a fib non-compliant on his  chronic anticoagulation. Had continual dysphagia and stayed NPO. Given continued substantial disability and patient lacking capacity, close contact was kept with his healthcare POA, daughter Micheal Holt, and his son. Pallative care helped with decision making. They ultimately decided to pursue comfort care as their father had previously discussed not wanting any feeding tube or life prolonging measures if he were to be in a state such as this. Comfort feeds were started knowing aspiration risk. At discharge to [facility], he was hemodynamically stable on 2L O2 with continued left hemiplegia, left facial droop, dysphagia, and slight improvement in dysarthria (able to speak few understandable words, follows commands). Anticoagulation/antiplatet and hypertensive therapy were stopped on discharge.   Issues for Follow Up:  1. Ensure continued comfort (including oral care, pain medication PRN) per patient's and family's wishes. Started on morphine 1 mg q2 PRN, however never used. Occasionally required haldol 0.5mg  for agitation.    Significant Procedures: None   Significant Labs and Imaging:  Recent Labs  Lab 01/01/19 0609 01/02/19 0347 01/03/19 0432  WBC 11.9* 10.3 9.4  HGB 15.5 16.9 16.3  HCT 46.5 51.8 48.6  PLT 288 301 312   Recent Labs  Lab 12/30/18 2003 12/31/18 0422 01/01/19 0609 01/02/19 0347 01/03/19 0432 01/03/19 1503  NA 138 140 143 144 146* 146*  K 3.8 4.3 3.2* 3.0* 2.7* 4.0  CL 105 107 108 103 105 109  CO2 17* 16* 23 23 24 23   GLUCOSE 242* 244* 134* 106* 135* 117*  BUN 19 21 23  28* 39* 42*  CREATININE 1.90* 2.03* 1.96* 1.89* 1.94* 2.02*  CALCIUM 8.9 8.8* 8.5* 8.7* 8.8* 8.7*  ALKPHOS 89  --   --   --   --   --  AST 21  --   --   --   --   --   ALT 12  --   --   --   --   --   ALBUMIN 3.5  --   --   --   --   --     MRI Head:  1. Acute/subacute large nonhemorrhagic infarct involving the right frontal operculum and insular cortex. 2. No flow evident in the M1 segments  at the bifurcations on either side. This is in part artifactual. Distal right M1 segment stenosis or occlusion is suspected. 3. Diffuse atherosclerotic changes in the cavernous internal carotid arteries and at the basilar artery. 4. Moderate diffuse atrophy and white matter disease reflects the sequela of chronic microvascular ischemia.  Results/Tests Pending at Time of Discharge: None   Discharge Medications:  Allergies as of 01/04/2019   No Known Allergies     Medication List    STOP taking these medications   apixaban 5 MG Tabs tablet Commonly known as:  Eliquis   DULoxetine 60 MG capsule Commonly known as:  CYMBALTA   furosemide 40 MG tablet Commonly known as:  LASIX   glipiZIDE 5 MG tablet Commonly known as:  GLUCOTROL   Jardiance 10 MG Tabs tablet Generic drug:  empagliflozin   metoprolol succinate 25 MG 24 hr tablet Commonly known as:  TOPROL-XL   metoprolol succinate 50 MG 24 hr tablet Commonly known as:  TOPROL-XL   Ozempic (1 MG/DOSE) 2 MG/1.5ML Sopn Generic drug:  Semaglutide (1 MG/DOSE)   potassium chloride SA 20 MEQ tablet Commonly known as:  K-DUR   Rivaroxaban 15 MG Tabs tablet Commonly known as:  XARELTO   Steglujan 5-100 MG Tabs Generic drug:  Ertugliflozin-SITagliptin   tamsulosin 0.4 MG Caps capsule Commonly known as:  FLOMAX   Vitamin D-3 25 MCG (1000 UT) Caps       Discharge Instructions: Please refer to Patient Instructions section of EMR for full details.  Patient was counseled important signs and symptoms that should prompt return to medical care, changes in medications, dietary instructions, activity restrictions, and follow up appointments.   Follow-Up Appointments:   Micheal Clan, DO 01/04/2019, 1:53 PM PGY-1, [place]

## 2019-01-04 NOTE — TOC Transition Note (Signed)
Transition of Care [facility]) - CM/SW Discharge Note   Patient Details  Name: Micheal Holt MRN: [id number] Date of Birth: [birth year]  [facility] CM/SW Contact:  Geralynn Ochs, LCSW Phone Number: 01/04/2019, 2:02 PM   Clinical Narrative: Nurse to call report to [phone]      Final next level of care: [place] Barriers to Discharge: No Barriers Identified   Patient Goals and CMS Choice Patient states their goals for this hospitalization and ongoing recovery are:: patient unable to participate in goal setting; family wants him kept comfortable      Discharge Placement                Patient to be transferred to facility by: [facility] Name of family member notified: Dorie Rank Patient and family notified of of transfer: 01/04/19  Discharge Plan and Services In-house Referral: Clinical Social Work                                   Social Determinants of Health (SDOH) Interventions     Readmission Risk Interventions No flowsheet data found.

## 2019-01-05 LAB — GLUCOSE, CAPILLARY
Glucose-Capillary: 105 mg/dL — ABNORMAL HIGH (ref 70–99)
Glucose-Capillary: 127 mg/dL — ABNORMAL HIGH (ref 70–99)
Glucose-Capillary: 100 mg/dL — ABNORMAL HIGH (ref 70–99)

## 2019-02-05 DEATH — deceased

## 2019-03-07 ENCOUNTER — Ambulatory Visit: Payer: Medicare Other | Admitting: Cardiovascular Disease
